# Patient Record
Sex: Male | Born: 1970 | Race: Black or African American | Hispanic: No | Marital: Single | Smoking: Never smoker
Health system: Southern US, Community
[De-identification: ages and names within clinical notes are randomized; demographics above are authoritative.]

## PROBLEM LIST (undated history)

## (undated) DIAGNOSIS — W3400XA Accidental discharge from unspecified firearms or gun, initial encounter: Secondary | ICD-10-CM

## (undated) DIAGNOSIS — I10 Essential (primary) hypertension: Secondary | ICD-10-CM

## (undated) HISTORY — PX: KNEE SURGERY: SHX244

## (undated) HISTORY — PX: JOINT REPLACEMENT: SHX530

---

## 2000-01-22 ENCOUNTER — Ambulatory Visit (HOSPITAL_COMMUNITY): Admission: RE | Admit: 2000-01-22 | Discharge: 2000-01-22 | Payer: Self-pay | Admitting: Family Medicine

## 2000-01-22 ENCOUNTER — Encounter: Payer: Self-pay | Admitting: Family Medicine

## 2000-07-07 ENCOUNTER — Ambulatory Visit (HOSPITAL_COMMUNITY): Admission: RE | Admit: 2000-07-07 | Discharge: 2000-07-07 | Payer: Self-pay

## 2004-09-29 ENCOUNTER — Ambulatory Visit (HOSPITAL_COMMUNITY): Admission: RE | Admit: 2004-09-29 | Discharge: 2004-09-29 | Payer: Self-pay | Admitting: Specialist

## 2008-11-12 ENCOUNTER — Encounter: Payer: Self-pay | Admitting: Orthopaedic Surgery

## 2008-11-12 ENCOUNTER — Inpatient Hospital Stay (HOSPITAL_COMMUNITY): Admission: EM | Admit: 2008-11-12 | Discharge: 2008-11-16 | Payer: Self-pay | Admitting: Emergency Medicine

## 2010-09-29 LAB — COMPREHENSIVE METABOLIC PANEL
Albumin: 4.3 g/dL (ref 3.5–5.2)
Alkaline Phosphatase: 63 U/L (ref 39–117)
BUN: 9 mg/dL (ref 6–23)
CO2: 25 mEq/L (ref 19–32)
Chloride: 104 mEq/L (ref 96–112)
Creatinine, Ser: 0.78 mg/dL (ref 0.4–1.5)
GFR calc non Af Amer: >60 mL/min (ref >60)
Potassium: 4.4 mEq/L (ref 3.5–5.1)
Total Bilirubin: 0.7 mg/dL (ref 0.3–1.2)

## 2010-09-29 LAB — DIFFERENTIAL
Basophils Absolute: 0.1 10*3/uL (ref 0.0–0.1)
Basophils Relative: 1 % (ref 0–1)
Eosinophils Relative: 0 % (ref 0–5)
Monocytes Absolute: 0.4 10*3/uL (ref 0.1–1.0)
Neutro Abs: 9.7 10*3/uL — ABNORMAL HIGH (ref 1.7–7.7)

## 2010-09-29 LAB — CBC
HCT: 38.6 % — ABNORMAL LOW (ref 39.0–52.0)
Hemoglobin: 13.1 g/dL (ref 13.0–17.0)
MCHC: 33.9 g/dL (ref 30.0–36.0)
MCV: 90.8 fL (ref 78.0–100.0)
RBC: 4.24 MIL/uL (ref 4.22–5.81)
WBC: 10.8 10*3/uL — ABNORMAL HIGH (ref 4.0–10.5)

## 2010-09-29 LAB — APTT: aPTT: 24 seconds (ref 24–37)

## 2010-09-29 LAB — URINALYSIS, ROUTINE W REFLEX MICROSCOPIC
Bilirubin Urine: NEGATIVE
Glucose, UA: NEGATIVE mg/dL
Hgb urine dipstick: NEGATIVE
Specific Gravity, Urine: 1.007 (ref 1.005–1.030)
Urobilinogen, UA: 0.2 mg/dL (ref 0.0–1.0)
pH: 6 (ref 5.0–8.0)

## 2010-09-29 LAB — TYPE AND SCREEN

## 2010-09-29 LAB — HIV ANTIBODY (ROUTINE TESTING W REFLEX): HIV: NONREACTIVE

## 2010-09-29 LAB — PROTIME-INR: Prothrombin Time: 14.1 seconds (ref 11.6–15.2)

## 2010-09-29 LAB — CK: Total CK: 133 U/L (ref 7–232)
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
## 2010-12-20 ENCOUNTER — Emergency Department (HOSPITAL_COMMUNITY)
Admission: EM | Admit: 2010-12-20 | Discharge: 2010-12-20 | Disposition: A | Discharge status: Other Acute Inpatient Hospital | Payer: Self-pay | Attending: Emergency Medicine | Admitting: Emergency Medicine

## 2010-12-20 DIAGNOSIS — T25319A Burn of third degree of unspecified ankle, initial encounter: Secondary | ICD-10-CM | POA: Insufficient documentation

## 2010-12-20 DIAGNOSIS — T23279A Burn of second degree of unspecified wrist, initial encounter: Secondary | ICD-10-CM | POA: Insufficient documentation

## 2010-12-20 DIAGNOSIS — Y990 Civilian activity done for income or pay: Secondary | ICD-10-CM | POA: Insufficient documentation

## 2010-12-20 DIAGNOSIS — X19XXXA Contact with other heat and hot substances, initial encounter: Secondary | ICD-10-CM | POA: Insufficient documentation

## 2010-12-20 DIAGNOSIS — M7989 Other specified soft tissue disorders: Secondary | ICD-10-CM | POA: Insufficient documentation

## 2010-12-20 DIAGNOSIS — T24339A Burn of third degree of unspecified lower leg, initial encounter: Secondary | ICD-10-CM | POA: Insufficient documentation

## 2010-12-20 DIAGNOSIS — T23359A Burn of third degree of unspecified palm, initial encounter: Secondary | ICD-10-CM | POA: Insufficient documentation

## 2010-12-27 ENCOUNTER — Inpatient Hospital Stay (HOSPITAL_COMMUNITY)
Admission: EM | Admit: 2010-12-27 | Discharge: 2011-01-06 | DRG: 935 | Disposition: A | Discharge status: Home Health | Payer: Self-pay | Attending: Family Medicine | Admitting: Family Medicine

## 2010-12-27 DIAGNOSIS — T148XXA Other injury of unspecified body region, initial encounter: Secondary | ICD-10-CM | POA: Diagnosis present

## 2010-12-27 DIAGNOSIS — Y998 Other external cause status: Secondary | ICD-10-CM

## 2010-12-27 DIAGNOSIS — X131XXA Other contact with steam and other hot vapors, initial encounter: Secondary | ICD-10-CM | POA: Diagnosis present

## 2010-12-27 DIAGNOSIS — L089 Local infection of the skin and subcutaneous tissue, unspecified: Secondary | ICD-10-CM | POA: Diagnosis present

## 2010-12-27 DIAGNOSIS — Z5900 Homelessness unspecified: Secondary | ICD-10-CM

## 2010-12-27 DIAGNOSIS — D638 Anemia in other chronic diseases classified elsewhere: Secondary | ICD-10-CM | POA: Diagnosis present

## 2010-12-27 DIAGNOSIS — X12XXXA Contact with other hot fluids, initial encounter: Secondary | ICD-10-CM | POA: Diagnosis present

## 2010-12-27 DIAGNOSIS — T23209A Burn of second degree of unspecified hand, unspecified site, initial encounter: Principal | ICD-10-CM | POA: Diagnosis present

## 2010-12-27 DIAGNOSIS — I10 Essential (primary) hypertension: Secondary | ICD-10-CM | POA: Diagnosis present

## 2010-12-27 DIAGNOSIS — T25229A Burn of second degree of unspecified foot, initial encounter: Secondary | ICD-10-CM | POA: Diagnosis present

## 2010-12-27 DIAGNOSIS — Y9229 Other specified public building as the place of occurrence of the external cause: Secondary | ICD-10-CM

## 2010-12-27 LAB — BASIC METABOLIC PANEL
CO2: 22 mEq/L (ref 19–32)
Chloride: 96 mEq/L (ref 96–112)
Glucose, Bld: 89 mg/dL (ref 70–99)
Potassium: 3.6 mEq/L (ref 3.5–5.1)
Sodium: 133 mEq/L — ABNORMAL LOW (ref 135–145)

## 2010-12-27 LAB — DIFFERENTIAL
Basophils Absolute: 0 10*3/uL (ref 0.0–0.1)
Basophils Relative: 0 % (ref 0–1)
Lymphocytes Relative: 16 % (ref 12–46)
Neutro Abs: 7.5 10*3/uL (ref 1.7–7.7)
Neutrophils Relative %: 70 % (ref 43–77)

## 2010-12-27 LAB — URINALYSIS, ROUTINE W REFLEX MICROSCOPIC
Glucose, UA: NEGATIVE mg/dL
Hgb urine dipstick: NEGATIVE
Specific Gravity, Urine: 1.017 (ref 1.005–1.030)
Urobilinogen, UA: 1 mg/dL (ref 0.0–1.0)
pH: 5.5 (ref 5.0–8.0)

## 2010-12-27 LAB — CBC
Hemoglobin: 12.4 g/dL — ABNORMAL LOW (ref 13.0–17.0)
RBC: 4.01 MIL/uL — ABNORMAL LOW (ref 4.22–5.81)

## 2010-12-28 ENCOUNTER — Inpatient Hospital Stay (HOSPITAL_COMMUNITY): Payer: Self-pay

## 2010-12-28 LAB — MRSA PCR SCREENING: MRSA by PCR: NEGATIVE

## 2010-12-29 LAB — CBC
HCT: 32.9 % — ABNORMAL LOW (ref 39.0–52.0)
RDW: 12.5 % (ref 11.5–15.5)
WBC: 7.1 10*3/uL (ref 4.0–10.5)
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
## 2010-12-31 LAB — BASIC METABOLIC PANEL
CO2: 30 mEq/L (ref 19–32)
Calcium: 8.8 mg/dL (ref 8.4–10.5)
Glucose, Bld: 93 mg/dL (ref 70–99)
Potassium: 3.9 mEq/L (ref 3.5–5.1)
Sodium: 138 mEq/L (ref 135–145)

## 2010-12-31 LAB — CBC
Hemoglobin: 11.1 g/dL — ABNORMAL LOW (ref 13.0–17.0)
MCH: 30 pg (ref 26.0–34.0)
RBC: 3.7 MIL/uL — ABNORMAL LOW (ref 4.22–5.81)

## 2010-12-31 LAB — IRON AND TIBC
Iron: 46 ug/dL (ref 42–135)
UIBC: 168 ug/dL

## 2010-12-31 LAB — FOLATE: Folate: 11.5 ng/mL

## 2010-12-31 LAB — HIV 1/2 CONFIRMATION: HIV-1 antibody: NEGATIVE

## 2011-01-01 LAB — SURGICAL PCR SCREEN
MRSA, PCR: NEGATIVE
Staphylococcus aureus: POSITIVE — AB
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
## 2011-01-02 LAB — CBC
MCV: 91.1 fL (ref 78.0–100.0)
Platelets: 504 10*3/uL — ABNORMAL HIGH (ref 150–400)
RDW: 12.8 % (ref 11.5–15.5)
WBC: 7.4 10*3/uL (ref 4.0–10.5)

## 2011-01-02 LAB — BASIC METABOLIC PANEL
CO2: 33 mEq/L — ABNORMAL HIGH (ref 19–32)
Calcium: 9.3 mg/dL (ref 8.4–10.5)
Creatinine, Ser: 0.49 mg/dL — ABNORMAL LOW (ref 0.50–1.35)
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
## 2011-01-06 NOTE — Discharge Summary (Signed)
  NAMEONDRE, Trevor Blackwell NO.:  [id number]  MEDICAL RECORD NO.:  [id number]  LOCATION:  4507                         FACILITY:  [facility]  PHYSICIAN:  Standley Dakins, MD   DATE OF BIRTH:  1970-12-14  DATE OF ADMISSION:  12/27/2010 DATE OF DISCHARGE:  01/06/2011                              DISCHARGE SUMMARY   ADDENDUM: Addendum to the discharge summary dictated on January 05, 2011, job number [id number].  DISCHARGE MEDICATIONS: 1. Augmentin 875 mg one p.o. b.i.d. x7 days. 2. Folic acid 1 mg one p.o. daily. 3. Hydrocodone acetaminophen 5/325 one p.o. q.4 h. p.r.n. pain. 4. Lisinopril 20 mg 1 tablet p.o. daily. 5. Multivitamin 1 p.o. daily with minerals. 6. Silver sulfadiazine 1% cream applied topically twice daily to     wounds. 7. Thiamine 100 mg tablets one p.o. by mouth daily.  ADDENDUM TO HOSPITAL COURSE:  The patient was seen and evaluated on January 06, 2011.  The patient was evaluated by Dr. Kelly Splinter with Plastic Surgery and the wound VAC was removed.  They have not recommended that be continued.  The patient will continue to have wound care and wound changes as recommended by Plastic Surgery.  Arrangements have been made for the patient with the [facility] program, so that he can have at least a 30- day hotel stay and also we will continue to receive home health nursing, PT/OT and a home aide.  These were arranged prior to discharge.  Also arrangements made to help the patient obtain his medications at discharge, so that he will have them available.  Transportation arrangements will also be made for the patient.  The patient was evaluated on January 06, 2011, in no distress, cooperative and pleasant. Wound dressings have recently been changed.  PHYSICAL EXAMINATION:  VITAL SIGNS:  Were reviewed.  Temperature 98.2, pulse 76, respirations 20, blood pressure 144/86, and pulse ox 98% on room air. GENERAL:  Well-developed, well-nourished male.  He is awake, alert in  no distress, cooperative, and pleasant. HEENT: Normocephalic, atraumatic. LUNGS:  Clear to auscultation bilateral. CARDIAC:  Normal S1, S2 sounds without murmurs, rubs, or gallops. ABDOMEN:  Soft, nondistended, nontender, no masses. EXTREMITIES:  No clubbing, cyanosis, or edema. SKIN:  Wounds dressings clean, dry, and intact.  IMPRESSION/PLAN:  The patient is evaluated and stable for discharge with the whole program with the interventions mentioned above to help him to recover with dignity.  I spent 40 minutes preparing this patient's discharge, reviewing medical records, arranging followup reconciling medications and counseling with the care coordination team and with the patient.     Standley Dakins, MD     CJ/MEDQ  D:  01/06/2011  T:  01/06/2011  Job:  [id number]  Electronically Signed by Standley Dakins  on 01/06/2011 06:10:50 PM
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
## 2011-01-27 ENCOUNTER — Encounter (HOSPITAL_BASED_OUTPATIENT_CLINIC_OR_DEPARTMENT_OTHER): Payer: Self-pay | Attending: Plastic Surgery

## 2011-01-27 DIAGNOSIS — Z79899 Other long term (current) drug therapy: Secondary | ICD-10-CM | POA: Insufficient documentation

## 2011-01-27 DIAGNOSIS — I10 Essential (primary) hypertension: Secondary | ICD-10-CM | POA: Insufficient documentation

## 2011-01-27 DIAGNOSIS — T310 Burns involving less than 10% of body surface: Secondary | ICD-10-CM | POA: Insufficient documentation

## 2011-01-27 DIAGNOSIS — X19XXXA Contact with other heat and hot substances, initial encounter: Secondary | ICD-10-CM | POA: Insufficient documentation

## 2011-01-27 DIAGNOSIS — T25029A Burn of unspecified degree of unspecified foot, initial encounter: Secondary | ICD-10-CM | POA: Insufficient documentation

## 2011-01-27 DIAGNOSIS — T2200XA Burn of unspecified degree of shoulder and upper limb, except wrist and hand, unspecified site, initial encounter: Secondary | ICD-10-CM | POA: Insufficient documentation
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
## 2011-02-24 ENCOUNTER — Encounter (HOSPITAL_BASED_OUTPATIENT_CLINIC_OR_DEPARTMENT_OTHER): Payer: Self-pay | Attending: Plastic Surgery

## 2011-02-24 DIAGNOSIS — X19XXXA Contact with other heat and hot substances, initial encounter: Secondary | ICD-10-CM | POA: Insufficient documentation

## 2011-02-24 DIAGNOSIS — T2200XA Burn of unspecified degree of shoulder and upper limb, except wrist and hand, unspecified site, initial encounter: Secondary | ICD-10-CM | POA: Insufficient documentation

## 2011-02-24 DIAGNOSIS — I10 Essential (primary) hypertension: Secondary | ICD-10-CM | POA: Insufficient documentation

## 2011-02-24 DIAGNOSIS — T310 Burns involving less than 10% of body surface: Secondary | ICD-10-CM | POA: Insufficient documentation

## 2011-02-24 DIAGNOSIS — T25029A Burn of unspecified degree of unspecified foot, initial encounter: Secondary | ICD-10-CM | POA: Insufficient documentation

## 2011-02-24 DIAGNOSIS — Z79899 Other long term (current) drug therapy: Secondary | ICD-10-CM | POA: Insufficient documentation

## 2011-02-24 DIAGNOSIS — L97809 Non-pressure chronic ulcer of other part of unspecified lower leg with unspecified severity: Secondary | ICD-10-CM | POA: Insufficient documentation
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
## 2011-05-31 ENCOUNTER — Emergency Department (HOSPITAL_COMMUNITY)
Admission: EM | Admit: 2011-05-31 | Discharge: 2011-06-01 | Disposition: A | Discharge status: Home / Self Care | Payer: Self-pay | Attending: Emergency Medicine | Admitting: Emergency Medicine

## 2011-05-31 ENCOUNTER — Encounter: Payer: Self-pay | Admitting: Adult Health

## 2011-05-31 DIAGNOSIS — R1110 Vomiting, unspecified: Secondary | ICD-10-CM | POA: Insufficient documentation

## 2011-05-31 DIAGNOSIS — F1010 Alcohol abuse, uncomplicated: Secondary | ICD-10-CM | POA: Insufficient documentation

## 2011-05-31 DIAGNOSIS — F10929 Alcohol use, unspecified with intoxication, unspecified: Secondary | ICD-10-CM

## 2011-05-31 DIAGNOSIS — R10816 Epigastric abdominal tenderness: Secondary | ICD-10-CM | POA: Insufficient documentation

## 2011-05-31 DIAGNOSIS — R109 Unspecified abdominal pain: Secondary | ICD-10-CM | POA: Insufficient documentation

## 2011-05-31 LAB — COMPREHENSIVE METABOLIC PANEL
ALT: 20 U/L (ref 0–53)
AST: 36 U/L (ref 0–37)
Albumin: 3.8 g/dL (ref 3.5–5.2)
Alkaline Phosphatase: 64 U/L (ref 39–117)
Chloride: 105 mEq/L (ref 96–112)
Potassium: 3.4 mEq/L — ABNORMAL LOW (ref 3.5–5.1)
Sodium: 143 mEq/L (ref 135–145)
Total Bilirubin: 0.3 mg/dL (ref 0.3–1.2)

## 2011-05-31 LAB — CBC
Hemoglobin: 12.5 g/dL — ABNORMAL LOW (ref 13.0–17.0)
MCHC: 33.2 g/dL (ref 30.0–36.0)
Platelets: 191 10*3/uL (ref 150–400)
RDW: 14.3 % (ref 11.5–15.5)

## 2011-05-31 LAB — DIFFERENTIAL
Basophils Absolute: 0 10*3/uL (ref 0.0–0.1)
Basophils Relative: 1 % (ref 0–1)
Monocytes Relative: 10 % (ref 3–12)
Neutro Abs: 1.9 10*3/uL (ref 1.7–7.7)
Neutrophils Relative %: 46 % (ref 43–77)

## 2011-05-31 LAB — RAPID URINE DRUG SCREEN, HOSP PERFORMED
Amphetamines: NOT DETECTED
Benzodiazepines: NOT DETECTED
Tetrahydrocannabinol: NOT DETECTED
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
## 2011-05-31 NOTE — ED Notes (Signed)
Pt refused a PIV

## 2011-06-01 LAB — ETHANOL: Alcohol, Ethyl (B): 253 mg/dL — ABNORMAL HIGH (ref 0–11)

## 2011-06-01 NOTE — ED Provider Notes (Signed)
Medical screening examination/treatment/procedure(s) were performed by non-physician practitioner and as supervising physician I was immediately available for consultation/collaboration.  Denee Boeder P Terrance Lanahan, MD 06/01/11 0016 

## 2011-06-01 NOTE — ED Provider Notes (Signed)
Medical screening examination/treatment/procedure(s) were performed by non-physician practitioner and as supervising physician I was immediately available for consultation/collaboration.   Lyanne Co, MD 06/01/11 [phone]
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
## 2011-06-01 NOTE — ED Provider Notes (Signed)
Medical screening examination/treatment/procedure(s) were performed by non-physician practitioner and as supervising physician I was immediately available for consultation/collaboration.   Lyanne Co, MD 06/01/11 2226

## 2012-02-19 ENCOUNTER — Emergency Department (HOSPITAL_COMMUNITY)
Admission: EM | Admit: 2012-02-19 | Discharge: 2012-02-20 | Disposition: A | Discharge status: Home / Self Care | Payer: Self-pay | Attending: Emergency Medicine | Admitting: Emergency Medicine

## 2012-02-19 ENCOUNTER — Encounter (HOSPITAL_COMMUNITY): Payer: Self-pay | Admitting: *Deleted

## 2012-02-19 DIAGNOSIS — R5381 Other malaise: Secondary | ICD-10-CM | POA: Insufficient documentation

## 2012-02-19 DIAGNOSIS — F10929 Alcohol use, unspecified with intoxication, unspecified: Secondary | ICD-10-CM

## 2012-02-19 DIAGNOSIS — R269 Unspecified abnormalities of gait and mobility: Secondary | ICD-10-CM | POA: Insufficient documentation

## 2012-02-19 DIAGNOSIS — F1010 Alcohol abuse, uncomplicated: Secondary | ICD-10-CM | POA: Insufficient documentation

## 2012-02-19 HISTORY — DX: Accidental discharge from unspecified firearms or gun, initial encounter: W34.00XA

## 2012-02-19 LAB — POCT I-STAT, CHEM 8
BUN: 4 mg/dL — ABNORMAL LOW (ref 6–23)
Calcium, Ion: 0.98 mmol/L — ABNORMAL LOW (ref 1.12–1.23)
Glucose, Bld: 86 mg/dL (ref 70–99)
HCT: 39 % (ref 39.0–52.0)
TCO2: 25 mmol/L (ref 0–100)

## 2012-02-19 LAB — ETHANOL: Alcohol, Ethyl (B): 376 mg/dL — ABNORMAL HIGH (ref 0–11)

## 2012-02-19 LAB — GLUCOSE, CAPILLARY: Glucose-Capillary: 79 mg/dL (ref 70–99)

## 2012-02-19 MED ORDER — DILTIAZEM HCL 25 MG/5ML IV SOLN
15.0000 mg | Freq: Once | INTRAVENOUS | Status: DC
Start: 2012-02-19 — End: 2012-02-19

## 2012-02-19 MED ORDER — DILTIAZEM LOAD VIA INFUSION: 15.0000 mg | Freq: Once | INTRAVENOUS | Status: DC
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
## 2012-02-19 NOTE — ED Notes (Signed)
Pt. Is alert but Does not speak when asked what brings him to the ED. Pt had vomit on clothes; changed into gown and placed on monitor.
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
## 2012-02-20 LAB — LIPASE, BLOOD: Lipase: 20 U/L (ref 11–59)

## 2012-02-20 LAB — HEPATIC FUNCTION PANEL
Alkaline Phosphatase: 108 U/L (ref 39–117)
Bilirubin, Direct: 0.3 mg/dL (ref 0.0–0.3)
Indirect Bilirubin: 0.5 mg/dL (ref 0.3–0.9)
Total Bilirubin: 0.8 mg/dL (ref 0.3–1.2)

## 2012-02-20 MED ORDER — ONDANSETRON HCL 4 MG/2ML IJ SOLN
4.0000 mg | Freq: Once | INTRAMUSCULAR | Status: AC
  Administered 2012-02-20: 4 mg via INTRAVENOUS
  Filled 2012-02-20: qty 2

## 2012-02-20 MED ORDER — LORAZEPAM 2 MG/ML IJ SOLN
1.0000 mg | Freq: Once | INTRAMUSCULAR | Status: AC
  Administered 2012-02-20: 10:00:00 via INTRAVENOUS
  Filled 2012-02-20: qty 1

## 2012-02-20 MED ORDER — SODIUM CHLORIDE 0.9 % IV BOLUS (SEPSIS)
1000.0000 mL | Freq: Once | INTRAVENOUS | Status: AC
  Administered 2012-02-20: 1000 mL via INTRAVENOUS
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
## 2012-02-20 NOTE — ED Notes (Signed)
Patient discharged with a friend instructions given and teach back method used.

## 2012-02-20 NOTE — ED Notes (Signed)
Shower completed Social worker contacted to talk with the patients sponsor. He advised that the patient is homeless.
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
## 2012-02-20 NOTE — ED Provider Notes (Signed)
  Physical Exam  BP 130/87  Pulse 99  Temp 98 F (36.7 C)  Resp 20  SpO2 100%  Physical Exam  ED Course  Procedures  MDM Patient's mental status improved. He is tolerated orals. His sponsor is in this country have seen him and will help arrange followup.      Juliet Rude. Rubin Payor, MD 02/20/12 [phone]

## 2012-09-05 ENCOUNTER — Emergency Department (HOSPITAL_COMMUNITY): Payer: Self-pay

## 2012-09-05 ENCOUNTER — Emergency Department (HOSPITAL_COMMUNITY)
Admission: EM | Admit: 2012-09-05 | Discharge: 2012-09-05 | Disposition: A | Discharge status: Home / Self Care | Payer: Self-pay | Attending: Emergency Medicine | Admitting: Emergency Medicine

## 2012-09-05 ENCOUNTER — Encounter (HOSPITAL_COMMUNITY): Payer: Self-pay | Admitting: Emergency Medicine

## 2012-09-05 DIAGNOSIS — S8000XA Contusion of unspecified knee, initial encounter: Secondary | ICD-10-CM | POA: Insufficient documentation

## 2012-09-05 DIAGNOSIS — G8929 Other chronic pain: Secondary | ICD-10-CM | POA: Insufficient documentation

## 2012-09-05 DIAGNOSIS — W19XXXA Unspecified fall, initial encounter: Secondary | ICD-10-CM

## 2012-09-05 DIAGNOSIS — R296 Repeated falls: Secondary | ICD-10-CM | POA: Insufficient documentation

## 2012-09-05 DIAGNOSIS — IMO0002 Reserved for concepts with insufficient information to code with codable children: Secondary | ICD-10-CM | POA: Insufficient documentation

## 2012-09-05 DIAGNOSIS — S8001XA Contusion of right knee, initial encounter: Secondary | ICD-10-CM

## 2012-09-05 DIAGNOSIS — Z87828 Personal history of other (healed) physical injury and trauma: Secondary | ICD-10-CM | POA: Insufficient documentation

## 2012-09-05 DIAGNOSIS — Y9301 Activity, walking, marching and hiking: Secondary | ICD-10-CM | POA: Insufficient documentation

## 2012-09-05 DIAGNOSIS — Y9289 Other specified places as the place of occurrence of the external cause: Secondary | ICD-10-CM | POA: Insufficient documentation

## 2012-09-05 MED ORDER — HYDROCODONE-ACETAMINOPHEN 5-325 MG PO TABS: 1.0000 | ORAL_TABLET | Freq: Once | ORAL | Status: DC

## 2012-09-05 MED ORDER — HYDROCODONE-ACETAMINOPHEN 5-325 MG PO TABS: 1.0000 | ORAL_TABLET | Freq: Four times a day (QID) | ORAL | Status: DC | PRN
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
## 2012-09-05 NOTE — ED Notes (Signed)
MD at bedside. 

## 2012-09-05 NOTE — ED Notes (Addendum)
Pt was walking this morning on the sidewalk.  States chronic rt knee pain d/t surgery.  Fall made his knee pain worse.  Pt is ambulatory into ED.

## 2012-09-05 NOTE — ED Notes (Signed)
Patient transported to X-ray 

## 2012-09-05 NOTE — ED Notes (Signed)
[id number]<AV> Expected date:<BR> Expected time:<BR> Means of arrival:<BR> Comments:<BR> 42 y/o M Knee pain

## 2012-11-24 ENCOUNTER — Emergency Department (HOSPITAL_COMMUNITY)
Admission: EM | Admit: 2012-11-24 | Discharge: 2012-11-25 | Disposition: A | Discharge status: Home / Self Care | Payer: Self-pay | Attending: Emergency Medicine | Admitting: Emergency Medicine

## 2012-11-24 ENCOUNTER — Encounter (HOSPITAL_COMMUNITY): Payer: Self-pay

## 2012-11-24 DIAGNOSIS — F10221 Alcohol dependence with intoxication delirium: Secondary | ICD-10-CM

## 2012-11-24 DIAGNOSIS — R1110 Vomiting, unspecified: Secondary | ICD-10-CM | POA: Insufficient documentation

## 2012-11-24 DIAGNOSIS — F10229 Alcohol dependence with intoxication, unspecified: Secondary | ICD-10-CM | POA: Insufficient documentation

## 2012-11-24 DIAGNOSIS — Z87828 Personal history of other (healed) physical injury and trauma: Secondary | ICD-10-CM | POA: Insufficient documentation

## 2012-11-24 LAB — BASIC METABOLIC PANEL
Calcium: 9.3 mg/dL (ref 8.4–10.5)
Creatinine, Ser: 0.58 mg/dL (ref 0.50–1.35)
GFR calc Af Amer: >90 mL/min (ref >90)

## 2012-11-24 LAB — CBC WITH DIFFERENTIAL/PLATELET
Basophils Absolute: 0 10*3/uL (ref 0.0–0.1)
Lymphocytes Relative: 27 % (ref 12–46)
Lymphs Abs: 1.2 10*3/uL (ref 0.7–4.0)
MCV: 87.5 fL (ref 78.0–100.0)
Monocytes Relative: 8 % (ref 3–12)
Platelets: 85 10*3/uL — ABNORMAL LOW (ref 150–400)
RDW: 13.6 % (ref 11.5–15.5)
WBC: 4.4 10*3/uL (ref 4.0–10.5)

## 2012-11-24 NOTE — ED Notes (Signed)
Pt given urinal and was informed that a urine sample was needed.  Pt unable to void at this time.
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
## 2012-11-25 ENCOUNTER — Encounter (HOSPITAL_COMMUNITY): Payer: Self-pay

## 2012-11-25 ENCOUNTER — Emergency Department (HOSPITAL_COMMUNITY)
Admission: EM | Admit: 2012-11-25 | Discharge: 2012-11-26 | Disposition: A | Discharge status: Home / Self Care | Payer: Self-pay | Attending: Emergency Medicine | Admitting: Emergency Medicine

## 2012-11-25 DIAGNOSIS — Z5900 Homelessness unspecified: Secondary | ICD-10-CM | POA: Insufficient documentation

## 2012-11-25 DIAGNOSIS — F10229 Alcohol dependence with intoxication, unspecified: Secondary | ICD-10-CM | POA: Insufficient documentation

## 2012-11-25 DIAGNOSIS — K529 Noninfective gastroenteritis and colitis, unspecified: Secondary | ICD-10-CM

## 2012-11-25 DIAGNOSIS — K5289 Other specified noninfective gastroenteritis and colitis: Secondary | ICD-10-CM | POA: Insufficient documentation

## 2012-11-25 DIAGNOSIS — Z87828 Personal history of other (healed) physical injury and trauma: Secondary | ICD-10-CM | POA: Insufficient documentation

## 2012-11-25 DIAGNOSIS — F10220 Alcohol dependence with intoxication, uncomplicated: Secondary | ICD-10-CM

## 2012-11-25 LAB — COMPREHENSIVE METABOLIC PANEL
ALT: 90 U/L — ABNORMAL HIGH (ref 0–53)
AST: 146 U/L — ABNORMAL HIGH (ref 0–37)
Albumin: 4.1 g/dL (ref 3.5–5.2)
Calcium: 9.6 mg/dL (ref 8.4–10.5)
Sodium: 137 mEq/L (ref 135–145)
Total Protein: 8.2 g/dL (ref 6.0–8.3)

## 2012-11-25 LAB — CBC WITH DIFFERENTIAL/PLATELET
Basophils Absolute: 0 10*3/uL (ref 0.0–0.1)
Basophils Relative: 0 % (ref 0–1)
Eosinophils Absolute: 0 10*3/uL (ref 0.0–0.7)
Eosinophils Relative: 0 % (ref 0–5)
Lymphocytes Relative: 10 % — ABNORMAL LOW (ref 12–46)
MCH: 30.5 pg (ref 26.0–34.0)
MCHC: 34.6 g/dL (ref 30.0–36.0)
MCV: 88.3 fL (ref 78.0–100.0)
Platelets: 75 10*3/uL — ABNORMAL LOW (ref 150–400)
RDW: 13.6 % (ref 11.5–15.5)
WBC: 8.2 10*3/uL (ref 4.0–10.5)

## 2012-11-25 MED ORDER — LORAZEPAM 1 MG PO TABS
1.0000 mg | ORAL_TABLET | Freq: Once | ORAL | Status: AC
  Administered 2012-11-25: 1 mg via ORAL
  Filled 2012-11-25: qty 1

## 2012-11-25 MED ORDER — ONDANSETRON 4 MG PO TBDP
4.0000 mg | ORAL_TABLET | Freq: Once | ORAL | Status: AC
  Administered 2012-11-25: 4 mg via ORAL
  Filled 2012-11-25: qty 1

## 2012-11-25 NOTE — ED Notes (Signed)
[id number]<BM> Expected date:<BR> Expected time:<BR> Means of arrival:<BR> Comments:<BR> Bed 2
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
## 2012-11-25 NOTE — ED Notes (Signed)
Pt drank lots of water and is oriented (x2). Pt only ate a small portion of breakfast before falling back asleep.

## 2012-11-25 NOTE — ED Notes (Signed)
Pt ambulated well 

## 2012-11-25 NOTE — ED Notes (Signed)
Pt c/o nausea and dry heaves following PO challenge. EDPA present during episode and gave verbal order for 4 mg of Zofran ODT. Pt also given a emesis bag.

## 2012-11-25 NOTE — ED Notes (Signed)
PT VERY DIFFICULT TO AROUSE. PT NOT AMBULATORY

## 2012-11-25 NOTE — ED Notes (Signed)
He pensively tells me he is "sick" and points to epigastrum as he states "I hurt here".  H also tells me he has vomited x 2 since yesterday.  He is calm and cooperative, and I smell ETOH on his breath.  His skin is normal, warm and dry, and he is breathing normally.

## 2012-11-25 NOTE — ED Notes (Signed)
Woke pt to get vitals, pt states still unable to urinate.

## 2012-11-25 NOTE — ED Provider Notes (Signed)
Pt ambulating without problem.  Alert and oriented.  abd exam benign.  Rolan Bucco, MD 11/25/12 [phone]

## 2012-11-26 MED ORDER — FOLIC ACID 1 MG PO TABS: 1.0000 mg | ORAL_TABLET | Freq: Every day | ORAL | Status: DC

## 2012-11-26 MED ORDER — VITAMIN B-1 100 MG PO TABS: 100.0000 mg | ORAL_TABLET | Freq: Every day | ORAL | Status: DC

## 2012-11-26 MED ORDER — THIAMINE HCL 100 MG/ML IJ SOLN: 100.0000 mg | Freq: Every day | INTRAMUSCULAR | Status: DC

## 2012-11-26 MED ORDER — LORAZEPAM 2 MG/ML IJ SOLN: 1.0000 mg | Freq: Four times a day (QID) | INTRAMUSCULAR | Status: DC | PRN

## 2012-11-26 MED ORDER — ADULT MULTIVITAMIN W/MINERALS CH: 1.0000 | ORAL_TABLET | Freq: Every day | ORAL | Status: DC

## 2012-11-26 MED ORDER — ONDANSETRON HCL 4 MG PO TABS: 4.0000 mg | ORAL_TABLET | Freq: Four times a day (QID) | ORAL | Status: DC

## 2012-11-26 MED ORDER — LORAZEPAM 1 MG PO TABS: 1.0000 mg | ORAL_TABLET | Freq: Four times a day (QID) | ORAL | Status: DC | PRN

## 2012-11-26 NOTE — ED Provider Notes (Signed)
Medical screening examination/treatment/procedure(s) were performed by non-physician practitioner and as supervising physician I was immediately available for consultation/collaboration.  Gilda Crease, MD 11/26/12 2248

## 2013-04-26 ENCOUNTER — Emergency Department (HOSPITAL_COMMUNITY): Payer: Self-pay

## 2013-04-26 ENCOUNTER — Encounter (HOSPITAL_COMMUNITY): Payer: Self-pay | Admitting: Emergency Medicine

## 2013-04-26 ENCOUNTER — Emergency Department (HOSPITAL_COMMUNITY)
Admission: EM | Admit: 2013-04-26 | Discharge: 2013-04-26 | Disposition: A | Discharge status: Home / Self Care | Payer: Self-pay | Attending: Emergency Medicine | Admitting: Emergency Medicine

## 2013-04-26 DIAGNOSIS — M25561 Pain in right knee: Secondary | ICD-10-CM

## 2013-04-26 DIAGNOSIS — S8990XA Unspecified injury of unspecified lower leg, initial encounter: Secondary | ICD-10-CM | POA: Insufficient documentation

## 2013-04-26 DIAGNOSIS — W1809XA Striking against other object with subsequent fall, initial encounter: Secondary | ICD-10-CM | POA: Insufficient documentation

## 2013-04-26 DIAGNOSIS — Y929 Unspecified place or not applicable: Secondary | ICD-10-CM | POA: Insufficient documentation

## 2013-04-26 DIAGNOSIS — Z87828 Personal history of other (healed) physical injury and trauma: Secondary | ICD-10-CM | POA: Insufficient documentation

## 2013-04-26 DIAGNOSIS — W19XXXA Unspecified fall, initial encounter: Secondary | ICD-10-CM

## 2013-04-26 DIAGNOSIS — Y9301 Activity, walking, marching and hiking: Secondary | ICD-10-CM | POA: Insufficient documentation

## 2013-04-26 MED ORDER — HYDROCODONE-ACETAMINOPHEN 5-325 MG PO TABS
2.0000 | ORAL_TABLET | Freq: Once | ORAL | Status: AC
  Administered 2013-04-26: 2 via ORAL
  Filled 2013-04-26: qty 2
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
## 2013-05-07 ENCOUNTER — Emergency Department (HOSPITAL_COMMUNITY)
Admission: EM | Admit: 2013-05-07 | Discharge: 2013-05-15 | Disposition: A | Discharge status: Other Acute Inpatient Hospital | Payer: Federal, State, Local not specified - Other | Attending: Emergency Medicine | Admitting: Emergency Medicine

## 2013-05-07 ENCOUNTER — Encounter (HOSPITAL_COMMUNITY): Payer: Self-pay | Admitting: Emergency Medicine

## 2013-05-07 DIAGNOSIS — R45851 Suicidal ideations: Secondary | ICD-10-CM | POA: Insufficient documentation

## 2013-05-07 DIAGNOSIS — F411 Generalized anxiety disorder: Secondary | ICD-10-CM | POA: Insufficient documentation

## 2013-05-07 DIAGNOSIS — F10229 Alcohol dependence with intoxication, unspecified: Secondary | ICD-10-CM | POA: Insufficient documentation

## 2013-05-07 DIAGNOSIS — R443 Hallucinations, unspecified: Secondary | ICD-10-CM | POA: Insufficient documentation

## 2013-05-07 DIAGNOSIS — Z5900 Homelessness unspecified: Secondary | ICD-10-CM | POA: Insufficient documentation

## 2013-05-07 DIAGNOSIS — Z87828 Personal history of other (healed) physical injury and trauma: Secondary | ICD-10-CM | POA: Insufficient documentation

## 2013-05-07 DIAGNOSIS — F29 Unspecified psychosis not due to a substance or known physiological condition: Secondary | ICD-10-CM | POA: Diagnosis present

## 2013-05-07 DIAGNOSIS — R440 Auditory hallucinations: Secondary | ICD-10-CM

## 2013-05-07 LAB — CBC
HCT: 39.5 % (ref 39.0–52.0)
Hemoglobin: 13.1 g/dL (ref 13.0–17.0)
MCH: 30.9 pg (ref 26.0–34.0)
MCHC: 33.2 g/dL (ref 30.0–36.0)

## 2013-05-07 LAB — RAPID URINE DRUG SCREEN, HOSP PERFORMED
Barbiturates: NOT DETECTED
Cocaine: NOT DETECTED
Opiates: NOT DETECTED

## 2013-05-07 LAB — COMPREHENSIVE METABOLIC PANEL
Alkaline Phosphatase: 73 U/L (ref 39–117)
BUN: 9 mg/dL (ref 6–23)
Calcium: 10 mg/dL (ref 8.4–10.5)
Creatinine, Ser: 0.51 mg/dL (ref 0.50–1.35)
GFR calc Af Amer: >90 mL/min (ref >90)
Glucose, Bld: 129 mg/dL — ABNORMAL HIGH (ref 70–99)
Total Protein: 8.7 g/dL — ABNORMAL HIGH (ref 6.0–8.3)

## 2013-05-07 LAB — ETHANOL: Alcohol, Ethyl (B): <11 mg/dL (ref 0–11)

## 2013-05-07 LAB — SALICYLATE LEVEL: Salicylate Lvl: <2 mg/dL — ABNORMAL LOW (ref 2.8–20.0)

## 2013-05-07 MED ORDER — ALUM & MAG HYDROXIDE-SIMETH 200-200-20 MG/5ML PO SUSP: 30.0000 mL | ORAL | Status: DC | PRN

## 2013-05-07 MED ORDER — ZOLPIDEM TARTRATE 5 MG PO TABS
5.0000 mg | ORAL_TABLET | Freq: Every evening | ORAL | Status: DC | PRN
  Administered 2013-05-09: 5 mg via ORAL
  Filled 2013-05-07: qty 1

## 2013-05-07 MED ORDER — ACETAMINOPHEN 325 MG PO TABS
650.0000 mg | ORAL_TABLET | ORAL | Status: DC | PRN
  Administered 2013-05-08 – 2013-05-15 (×8): 650 mg via ORAL
  Filled 2013-05-07 (×8): qty 2

## 2013-05-07 MED ORDER — IBUPROFEN 200 MG PO TABS
600.0000 mg | ORAL_TABLET | Freq: Three times a day (TID) | ORAL | Status: DC | PRN
  Administered 2013-05-13 – 2013-05-15 (×4): 600 mg via ORAL
  Filled 2013-05-07 (×6): qty 3

## 2013-05-07 MED ORDER — ONDANSETRON HCL 4 MG PO TABS: 4.0000 mg | ORAL_TABLET | Freq: Three times a day (TID) | ORAL | Status: DC | PRN
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
## 2013-05-07 NOTE — Progress Notes (Signed)
[facility] provided pt with a list of primary care resources, highlighting [facility] and [facility].
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
## 2013-05-07 NOTE — ED Notes (Signed)
Patient stated he wants to hurt himself. Thinks others want to harm him

## 2013-05-07 NOTE — ED Notes (Signed)
Dr. Freida Busman notified of pt's elevated BP. No orders received. Will continue to monitor pt's BP.

## 2013-05-07 NOTE — ED Notes (Signed)
Patient belongings in [place] locker 31

## 2013-05-08 ENCOUNTER — Encounter (HOSPITAL_COMMUNITY): Payer: Self-pay | Admitting: Registered Nurse

## 2013-05-08 DIAGNOSIS — F29 Unspecified psychosis not due to a substance or known physiological condition: Secondary | ICD-10-CM

## 2013-05-08 MED ORDER — LISINOPRIL 20 MG PO TABS
20.0000 mg | ORAL_TABLET | Freq: Once | ORAL | Status: AC
  Administered 2013-05-08: 20 mg via ORAL
  Filled 2013-05-08: qty 1

## 2013-05-08 MED ORDER — LORAZEPAM 2 MG/ML IJ SOLN
2.0000 mg | Freq: Once | INTRAMUSCULAR | Status: AC
  Administered 2013-05-09: 2 mg via INTRAMUSCULAR
  Filled 2013-05-08: qty 1

## 2013-05-08 MED ORDER — QUETIAPINE FUMARATE 300 MG PO TABS
300.0000 mg | ORAL_TABLET | Freq: Every day | ORAL | Status: DC
  Administered 2013-05-08: 300 mg via ORAL
  Filled 2013-05-08: qty 1

## 2013-05-08 MED ORDER — LORAZEPAM 1 MG PO TABS
1.0000 mg | ORAL_TABLET | Freq: Once | ORAL | Status: AC
  Administered 2013-05-08: 1 mg via ORAL
  Filled 2013-05-08: qty 1

## 2013-05-08 MED ORDER — LISINOPRIL 5 MG PO TABS
5.0000 mg | ORAL_TABLET | Freq: Every day | ORAL | Status: DC
  Administered 2013-05-09 – 2013-05-15 (×7): 5 mg via ORAL
  Filled 2013-05-08 (×7): qty 1

## 2013-05-08 NOTE — ED Provider Notes (Signed)
Medical screening examination/treatment/procedure(s) were performed by non-physician practitioner and as supervising physician I was immediately available for consultation/collaboration.  EKG Interpretation   None        Juliet Rude. Rubin Payor, MD 05/08/13 [phone]

## 2013-05-08 NOTE — ED Notes (Signed)
Notified EDP of pt.'s BP: 166/116 (manual).  Order for new medication.  Will continue to monitor pt.

## 2013-05-08 NOTE — ED Notes (Signed)
Patient appears confused, weak, trying to stand. Patient BP drop to 110/73 with pulse 122. Instructed patient on fall precautions, and changing positions slowly. EDP notified.

## 2013-05-08 NOTE — ED Notes (Signed)
Notified EDP of pt.'s B/P.  No new orders given, will continue to monitor pt.

## 2013-05-08 NOTE — ED Notes (Signed)
Pt walking down hall attempting to open doors. Pt begins urinating in hall as he is walking. Pt walked to bathroom and went back to room and sat on bed. Staff member went to his room and gave pt new scrubs and new socks and advised to change.

## 2013-05-08 NOTE — ED Provider Notes (Addendum)
6:14 PM Pt with persistently high BP in ED. Hx of same. Per review of old records has been on lisinopril 20mg  previously. Renal function fine. Orders for this entered.   Raeford Razor, MD 05/08/13 1817  11:27 PM Called by nursing. Tachycardic. BP significantly lower than has been, but not hypotensive. May have overshot BP. Rectal temp 99.1.  Pt assessed. Tachycardic consistent with recorded vitals. Lungs clear. Abdomen benign. Stood pt beside bed and was somewhat unsteady. Difficult to get good history from pt, but denies pain. Encouraged to drink water at bedside. Will continue to observe at this time.     Raeford Razor, MD 05/08/13 [phone]
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
## 2013-05-08 NOTE — Progress Notes (Signed)
Patient meets criteria for inpatient hospitalization, per Dr. Ladona Ridgel.   Incoming staff will refer to other hospitals.

## 2013-05-08 NOTE — Consult Note (Signed)
Note reviewed and agree with

## 2013-05-08 NOTE — ED Notes (Signed)
Pt dinner removed from room.  Pt refused to eat.  Pt within sight of RN at all times for safety.

## 2013-05-08 NOTE — ED Notes (Signed)
Patient BP improved to 143/97 at 2030. Patient received tylenol 1954 for headache, also improved. Patient reports SI/HI. Patient contracts for safety. Patient resting quietly in his room.
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
## 2013-05-08 NOTE — B[facility] Counselor (Addendum)
Writer was called by TTS Ava who states pt's RN told her that pt has been waiting for telepsych since yesterday 05/08/13. Writer checked TTS telepsych log and there is no record of telepsych order having been called in by [facility]. Writer called RN Tanika and scheduled TA for 8:15. Writer called and spoke w/ EDP Lynelle Doctor re: patient and notified EDP that pt's TA is scheduled.  Evette Cristal, [place] Assessment Counselor

## 2013-05-09 ENCOUNTER — Encounter (HOSPITAL_COMMUNITY): Payer: Self-pay | Admitting: Registered Nurse

## 2013-05-09 DIAGNOSIS — F22 Delusional disorders: Secondary | ICD-10-CM

## 2013-05-09 DIAGNOSIS — F411 Generalized anxiety disorder: Secondary | ICD-10-CM

## 2013-05-09 DIAGNOSIS — R443 Hallucinations, unspecified: Secondary | ICD-10-CM

## 2013-05-09 DIAGNOSIS — F329 Major depressive disorder, single episode, unspecified: Secondary | ICD-10-CM

## 2013-05-09 LAB — COMPREHENSIVE METABOLIC PANEL
Albumin: 3.9 g/dL (ref 3.5–5.2)
Alkaline Phosphatase: 61 U/L (ref 39–117)
BUN: 10 mg/dL (ref 6–23)
Calcium: 9.5 mg/dL (ref 8.4–10.5)
Creatinine, Ser: 0.59 mg/dL (ref 0.50–1.35)
GFR calc Af Amer: >90 mL/min (ref >90)
Glucose, Bld: 141 mg/dL — ABNORMAL HIGH (ref 70–99)
Potassium: 3.2 mEq/L — ABNORMAL LOW (ref 3.5–5.1)
Total Protein: 7.8 g/dL (ref 6.0–8.3)

## 2013-05-09 LAB — CBC WITH DIFFERENTIAL/PLATELET
Basophils Absolute: 0 10*3/uL (ref 0.0–0.1)
Basophils Relative: 0 % (ref 0–1)
Eosinophils Absolute: 0.1 10*3/uL (ref 0.0–0.7)
Eosinophils Relative: 1 % (ref 0–5)
Hemoglobin: 13.3 g/dL (ref 13.0–17.0)
MCH: 31.1 pg (ref 26.0–34.0)
MCHC: 34 g/dL (ref 30.0–36.0)
MCV: 91.6 fL (ref 78.0–100.0)
Monocytes Absolute: 0.6 10*3/uL (ref 0.1–1.0)
Monocytes Relative: 10 % (ref 3–12)
Neutrophils Relative %: 66 % (ref 43–77)

## 2013-05-09 MED ORDER — LORAZEPAM 1 MG PO TABS
0.0000 mg | ORAL_TABLET | Freq: Two times a day (BID) | ORAL | Status: AC
  Administered 2013-05-12: 1 mg via ORAL
  Filled 2013-05-09 (×2): qty 1

## 2013-05-09 MED ORDER — QUETIAPINE FUMARATE 300 MG PO TABS
300.0000 mg | ORAL_TABLET | Freq: Two times a day (BID) | ORAL | Status: DC
  Administered 2013-05-09 – 2013-05-15 (×11): 300 mg via ORAL
  Filled 2013-05-09 (×12): qty 1

## 2013-05-09 MED ORDER — CLONIDINE HCL 0.1 MG PO TABS
0.2000 mg | ORAL_TABLET | Freq: Once | ORAL | Status: AC
  Administered 2013-05-09: 0.2 mg via ORAL
  Filled 2013-05-09: qty 2

## 2013-05-09 MED ORDER — LORAZEPAM 1 MG PO TABS
0.0000 mg | ORAL_TABLET | Freq: Four times a day (QID) | ORAL | Status: AC
  Administered 2013-05-09 (×2): 1 mg via ORAL
  Administered 2013-05-10: 2 mg via ORAL
  Administered 2013-05-11: 1 mg via ORAL
  Filled 2013-05-09: qty 1
  Filled 2013-05-09: qty 2
  Filled 2013-05-09 (×2): qty 1

## 2013-05-09 MED ORDER — SODIUM CHLORIDE 0.9 % IV SOLN
Freq: Once | INTRAVENOUS | Status: AC
  Administered 2013-05-09: 01:00:00 via INTRAVENOUS

## 2013-05-09 MED ORDER — ZIPRASIDONE MESYLATE 20 MG IM SOLR
10.0000 mg | Freq: Once | INTRAMUSCULAR | Status: AC
  Administered 2013-05-09: 10 mg via INTRAMUSCULAR
  Filled 2013-05-09: qty 20

## 2013-05-09 NOTE — ED Notes (Signed)
Pt assisted with breakfast.  Pt ate 10% of meal before refusing.

## 2013-05-09 NOTE — ED Notes (Signed)
Pt ate 100% of meal.

## 2013-05-09 NOTE — Progress Notes (Signed)
Pt remains confused, needing moderate amount of redirecting. Pt in restraints with attempts to get out of bed.  Pt remains unable to provide pcp information to CM Pt previously given information by [facility] staff on 05/07/13

## 2013-05-09 NOTE — Consult Note (Signed)
  Subjective:    Patient continues to hallucinate and feel that there is a tribe waiting outside to kill him.  Patient is calm and relaxed.  Patient in restraints related to wondering not behavioral problems.  Psychiatric Specialty Exam: Physical Exam  ROS  Blood pressure 128/96, pulse 104, temperature 97.3 F (36.3 C), temperature source Axillary, resp. rate 20, SpO2 100.00%.There is no height or weight on file to calculate BMI.  General Appearance: Casual  Eye Contact::  Fair  Speech:  Slow and difficult to understand related to accent  Volume:  Decreased  Mood:  Anxious, Depressed and Hopeless  Affect:  Blunt, Depressed and Flat  Thought Process:  Circumstantial  Orientation:  Full (Time, Place, and Person)  Thought Content:  Hallucinations: Auditory Visual and Paranoid Ideation  Suicidal Thoughts:  No  Homicidal Thoughts:  No  Memory:  Immediate;   Fair Recent;   Fair  Judgement:  Impaired  Insight:  Lacking  Psychomotor Activity:  Normal  Concentration:  Fair  Recall:  Fair  Akathisia:  No  Handed:  Right  AIMS (if indicated):     Assets:  Desire for Improvement  Sleep:      Face to face interview/consult with Dr. Ladona Ridgel  Disposition:  Will continue with current plan for inpatient treatment.  Patient is [facility] for [facility] at [facility] when bed is available.  Will discuss with AC.  Continue to look for inpatient placement at other facilities.  Monitor for safety and stabilization until inpatient treatment bed is found.  Alfredo Collymore B. Gelila Well FNP-BC Family Nurse Practitioner, Board Certified

## 2013-05-09 NOTE — ED Notes (Signed)
Patient remain very agitated, non compliant, high fall risk- attempting to stand in the bed to get out of bed . Reoriented to policy and rules. Very impulsive behavior at this time. Patient cont to not follow instruction. Will make attending doctor aware.  Will cont to monitor.

## 2013-05-09 NOTE — ED Notes (Signed)
Patient belongings are in [place] # 09

## 2013-05-09 NOTE — ED Notes (Signed)
Unable to get pt temp

## 2013-05-09 NOTE — BHH Counselor (Signed)
Writer faxed referral to [facility]. Per Tamika at [facility], they have "one or two beds" available.  Evette Cristal, [place] Assessment Counselor

## 2013-05-10 NOTE — Progress Notes (Signed)
Referred to [facility]. Incoming staff will follow up on referral

## 2013-05-10 NOTE — ED Notes (Signed)
Nurse and Clinical research associate took patient out of wrist restraints to ambulate patient to restroom. Patient was complaining of pain in right knee, and could not bend that leg. Patient was weak and needed 2 assist to get to restroom. Patient received a bath while in restroom than nurse and writer assisted patient to sit in recliner in room. Patient has been left out of restraints. Patient was calm and cooperative while nurse and Clinical research associate assisted patient to the restroom and to the recliner.

## 2013-05-10 NOTE — Progress Notes (Signed)
[name] reports that she will re run the patient for consideration at [facility].

## 2013-05-10 NOTE — ED Notes (Signed)
Charge talked to [facility] at [facility], pt declined from several places, still working on placement

## 2013-05-10 NOTE — ED Notes (Signed)
Nurse and I assisted patient to ambulate around TCU unit. Patient was stronger and not as weak. Patient is now resting in his recliner.

## 2013-05-10 NOTE — Progress Notes (Addendum)
CSW spoke with Joni Reining at [facility].  Pt declined due to behavioral acuity.  Marva Panda, Theresia Majors  [phone]  .05/10/2013 8:14 pm  Pt referred to [facility]. Pt referred to [facility].    Marva Panda, Theresia Majors  [phone]  05/10/2013

## 2013-05-10 NOTE — Consult Note (Signed)
  Psychiatric Specialty Exam: Physical Exam  ROS  Blood pressure 116/84, pulse 87, temperature 97.4 F (36.3 C), temperature source Oral, resp. rate 18, SpO2 100.00%.There is no height or weight on file to calculate BMI.  General Appearance: Disheveled  Eye Contact::  Absent  Speech:  Slurred  Volume:  Decreased  Mood:  falling asleep  Affect:  Flat  Thought Process:  minimal  Orientation:  Not able to determine today  Thought Content:  Delusions and Hallucinations: Auditory Visual  Suicidal Thoughts:  No  Homicidal Thoughts:  No  Memory:  Immediate;   Poor Recent;   Poor Remote;   Poor  Judgement:  Impaired  Insight:  Lacking  Psychomotor Activity:  Normal  Concentration:  Poor  Recall:  Poor  Akathisia:  Negative  Handed:  Right  AIMS (if indicated):     Assets:  Others:  currently psychotic  Sleep:   poor   Mr Barnett is currently sedated and falling asleep due to Seroquel dose last night.  Still awaiting a bed when one is available.

## 2013-05-11 ENCOUNTER — Emergency Department (HOSPITAL_COMMUNITY): Payer: Self-pay

## 2013-05-11 NOTE — Progress Notes (Signed)
[facility]: Spoke with Natackia @ 0140 No Beds Are Available. [facility]: Spoke with Staff @ [phone], have not had a chance to review any referrals but will be reviewing referrals soon.  Trevor Blackwell, MHT

## 2013-05-11 NOTE — Progress Notes (Addendum)
CSW refaxed referral to [facility] for review. Per Paula Compton due to unit acuity no admissions at this time.   Pt referred to [facility], however no appropriate beds available for pt at this time.  Pt pending reivew at [facility].   Catha Gosselin, LCSW [phone]  ED CSW .05/11/2013 1109am

## 2013-05-11 NOTE — ED Notes (Signed)
Got pt up out of the chair and assisted him to the bed with the NT, pt stated that he could not bend his right knee and that it was due to his fall. There has not been any report of pt falling here at the hospital and pt did not confirm as to how recent or old that he actually fell. Pt kept his leg straight and had to have two people assist due to him not bending his leg and from him being weak. MD is aware.

## 2013-05-11 NOTE — Progress Notes (Signed)
Patient ID: Trevor Blackwell, male   DOB: [birth year], 42 y.o.   MRN: [id number] Psychiatric Specialty Exam: Physical Exam  ROS  Blood pressure 115/67, pulse 83, temperature 97.4 F (36.3 C), temperature source Oral, resp. rate 18, SpO2 100.00%.There is no height or weight on file to calculate BMI.  General Appearance: Casual and Disheveled  Eye Contact::  None  Speech:  Slow  Volume:  Decreased  Mood:  Anxious, Depressed, Dysphoric and Hopeless  Affect:  Congruent, Constricted, Depressed and Flat  Thought Process:  Disorganized  Orientation:  Full (Time, Place, and Person)  Thought Content:  Hallucinations: Visual, Paranoid Ideation and SEEING PEOPLE FROM HIS COUNTRY TRYING TO KILL HIM  Suicidal Thoughts:  Yes.  with intent/plan  Homicidal Thoughts:  No  Memory:  Immediate;   Fair Recent;   Fair Remote;   Fair  Judgement:  Poor  Insight:  Shallow  Psychomotor Activity:  Decreased  Concentration:  Fair  Recall:  NA  Akathisia:  NA  Handed:  Right  AIMS (if indicated):     Assets:  Desire for Improvement  Sleep:      Saw patient today with Dr Tawni Carnes at his bedside.  Patient had his eye closed through out the entire assessment.  Patient is still paranoid and afraid that members from another tribe in his country are coming to kill him in the hospital.  Patient had an xray of his knees today because he is unable to walk as he has been when he came in.   Result of xray was significant to two metal pins lose and there was no frature  We will continue to seek for placement to other facilities as we at this time do not have bed.  We will continue to offer him his medications and continue to monitor patient.  Dahlia Byes   PMHNP-BC

## 2013-05-11 NOTE — Progress Notes (Signed)
Per discussion with nurse, patient have right knee pain, and a 2 person assist to the bathroom. Pt pending further medical evaluation at this time.   Catha Gosselin, LCSW [phone]  ED CSW .05/11/2013 11:14am

## 2013-05-11 NOTE — Progress Notes (Addendum)
CSW faxed referral to [facility] and completed verbal intake with Robinette.   Confirmed with Vonna Kotyk that fax was received.Pt information pending review.  Marva Panda, Theresia Majors  [phone]  .05/11/2013  4:45 pm  Referred to [facility].  Declined  .Karle Plumber  [phone]  .05/11/2013'

## 2013-05-11 NOTE — ED Provider Notes (Addendum)
Per RN, the patient is complaining of leg pain.  XR demonstrates posterior effusion, and two screws that are not fully engaged.  This finding is consistent with XR since 2010.  Gerhard Munch, MD 05/11/13 1132  (Late addendum) - after reviewing the patient's xr, I went to check on him.  He was sleeping, in no distress.  With his documented Hx of loose screws, this can be evaluated on a non-emergent basis.   Gerhard Munch, MD 05/11/13 1550

## 2013-05-12 LAB — URINALYSIS, ROUTINE W REFLEX MICROSCOPIC
Bilirubin Urine: NEGATIVE
Glucose, UA: NEGATIVE mg/dL
Hgb urine dipstick: NEGATIVE
Leukocytes, UA: NEGATIVE
Protein, ur: NEGATIVE mg/dL
pH: 5.5 (ref 5.0–8.0)

## 2013-05-12 NOTE — Progress Notes (Signed)
CSW called [facility] and spoke with Tresa Endo. CSW requested uric acid test so that placement process can progress with [facility]. Tresa Endo stated she will speak with RN to order test.  Mariann Laster, [phone]     ED CSW
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
## 2013-05-12 NOTE — ED Notes (Signed)
Pt ate 100% of his dinner   

## 2013-05-12 NOTE — Progress Notes (Addendum)
Magistrate Catha Nottingham confirmed that IVC papers are processed and sheriff will dispatch.  York Spaniel [place], [phone]     ED CSW  11:34am _________________________________  Louanne Belton confirmed receipt of IVC paperwork. CSW awaiting call to confirm sheriff has been dispatched.  York Spaniel Wishram, [phone]     ED CSW  11:18am

## 2013-05-12 NOTE — ED Notes (Signed)
Patient unable to be transferred in room in psych due to mobility limitation at this time. See previous notes.

## 2013-05-12 NOTE — ED Provider Notes (Signed)
Asked by psychiatry to evaluate patient for right knee pain. 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 He has been complaining of pain. Examination reveals no swelling, no erythema, no warmth. Extra images reviewed - hardware unchanged (included two loose screws ) from previous. Exam is not consistent with septic joint, or infection of any kind. Uric acid level obtained, but doubt Gout. This is a chronic problem and should not interfere with psychiatric treatment, or preclude inpatient treatment. Pain is chronic in nature and XRay findings do not warrant ortho evaluation or surgery. Await uric acid level.  Filed Vitals:   05/12/13 0612  BP: 119/77  Pulse: 86  Temp: 98.3 F (36.8 C)  Resp: 18     Gilda Crease, MD 05/12/13 [phone]

## 2013-05-12 NOTE — ED Notes (Signed)
Pt ate 100% of his breakfast.

## 2013-05-12 NOTE — ED Notes (Signed)
Bed: ZO10 Expected date:  Expected time:  Means of arrival:  Comments: Tcu hold

## 2013-05-12 NOTE — ED Notes (Signed)
ED tried to send this pt over from TCU to the psych ed, however, all the notes indicate pt cannot walk alone due to loose screws in his right knee per x-ray thus pt is a two person assist with ambulation. Let TCU RN Pam know that pt doesn't meet criteria to come to the psych ed due to being unable to ambulate alone. She agreed with this RN's decision and said they need her and a tech from TCU to go upstairs to work. She will let charge RN know. Writer will call [facility] AC just to keep her updated.

## 2013-05-13 ENCOUNTER — Encounter (HOSPITAL_COMMUNITY): Payer: Self-pay | Admitting: Registered Nurse

## 2013-05-13 NOTE — ED Notes (Signed)
Pt ate 25% of his dinner. 

## 2013-05-13 NOTE — ED Notes (Signed)
Pt sister called and is speaking with pt. Sitter at bedside

## 2013-05-13 NOTE — Progress Notes (Signed)
Contacted the following facilities to follow up on patient referral:  [facility]- Per Shanda Bumps, at capacity [facility]- Per [facility], pt was denied due to acuity  Rodman Pickle, MHT

## 2013-05-13 NOTE — ED Notes (Signed)
Pt ate 25% of his breakfast. 

## 2013-05-13 NOTE — Consult Note (Signed)
[place], IVC and Vitals faxed to [place] at [facility] for review

## 2013-05-13 NOTE — ED Notes (Signed)
Patient is resting comfortably. Sitter at bedside. Pt has even, unlabored resp
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
## 2013-05-13 NOTE — ED Provider Notes (Signed)
Pt seen by ortho.  Ortho sees no reason that the pt cannot walk.  No ortho treatment needed for now.    Benny Lennert, MD 05/13/13 [phone]

## 2013-05-13 NOTE — ED Notes (Signed)
Pt ate 25% of his lunch

## 2013-05-13 NOTE — Consult Note (Signed)
    Subjective:    Patient states that he doesn't know how he is doing. Patient states that he came to the hospital because he was having a problem with leg. Patient states that he is taking medication and sometimes they help and other times they do not.  Patient denies that he is hearing voices at this time.  Patient states that he lives on the street and has been homeless for two years.  "My family is at home in [place]."  Patient states that he has no prior psych history and has never been on psych medication.  Patient states that he was hospitalized in 2009 for his leg "I broke my knee."   Patient lying in bed during consult.  Was informed by Tech. That patient has been unable to stand and weak with ambulation. When patient using bathroom is unable to stand with out leg bending and patient falling.    Psychiatric Specialty Exam: Physical Exam  ROS  Blood pressure 152/90, pulse 85, temperature 98.2 F (36.8 C), temperature source Oral, resp. rate 16, SpO2 100.00%.There is no height or weight on file to calculate BMI.  General Appearance: Casual  Eye Contact::  Fair  Speech:  Slow and difficult to understand related to accent  Volume:  Decreased  Mood:  Anxious, Depressed and Hopeless  Affect:  Blunt, Depressed and Flat  Thought Process:  Circumstantial  Orientation:  Full (Time, Place, and Person)  Thought Content:  Hallucinations: Auditory Visual and Paranoid Ideation  Suicidal Thoughts:  No  Homicidal Thoughts:  No  Memory:  Immediate;   Fair Recent;   Fair  Judgement:  Impaired  Insight:  Lacking  Psychomotor Activity:  Normal  Concentration:  Fair  Recall:  Fair  Akathisia:  No  Handed:  Right  AIMS (if indicated):     Assets:  Desire for Improvement  Sleep:      Face to face interview/consult with Dr. Lolly Mustache  Disposition:  Will continue current treatment plan for inpatient treatment.  Patient will need to be medically treated with the swelling and tenderness in his leg and  why the patient is unable to bear weight or walk before a inpatient psychiatric inpatient facility will take.  Continue to monitor for safety and stabilization.  Shuvon B. Rankin FNP-BC Family Nurse Practitioner, Board Certified  I have personally seen the patient and agreed with the findings and involved in the treatment plan. Kathryne Sharper, MD

## 2013-05-14 LAB — COMPREHENSIVE METABOLIC PANEL
ALT: 19 U/L (ref 0–53)
AST: 22 U/L (ref 0–37)
Alkaline Phosphatase: 55 U/L (ref 39–117)
CO2: 29 mEq/L (ref 19–32)
Calcium: 10 mg/dL (ref 8.4–10.5)
Chloride: 101 mEq/L (ref 96–112)
GFR calc Af Amer: >90 mL/min (ref >90)
GFR calc non Af Amer: >90 mL/min (ref >90)
Glucose, Bld: 95 mg/dL (ref 70–99)
Sodium: 139 mEq/L (ref 135–145)
Total Bilirubin: 0.4 mg/dL (ref 0.3–1.2)

## 2013-05-14 NOTE — Progress Notes (Signed)
CSW confirmed with Junious Dresser that patient is still pending review, Dr. Allena Katz to review pt information today. CSW awaiting to hear back from [facility].   Catha Gosselin, LCSW [phone]  ED CSW .05/14/2013 931am

## 2013-05-14 NOTE — ED Notes (Signed)
Ambulated to TCU without assistance and used the restroom without assistance. He is calm and cooperative at present without hallucinations.

## 2013-05-14 NOTE — Progress Notes (Signed)
Patient ID: Trevor Blackwell, male   DOB: Oct 19, 1970, 42 y.o.   MRN: [id number] Psychiatric Specialty Exam: Physical Exam  ROS  Blood pressure 115/75, pulse 59, temperature 98.9 F (37.2 C), temperature source Oral, resp. rate 19, SpO2 99.00%.There is no height or weight on file to calculate BMI.  General Appearance: Casual  Eye Contact::  Fair  Speech:  Clear and Coherent and Normal Rate  Volume:  Normal  Mood:  Anxious and Depressed  Affect:  Congruent, Depressed and Flat  Thought Process:  Coherent  Orientation:  Full (Time, Place, and Person)  Thought Content:  SUICIDAL IDEATION  Suicidal Thoughts:  Yes.  without intent/plan  Homicidal Thoughts:  No  Memory:  Immediate;   Fair Recent;   Fair Remote;   Fair  Judgement:  Impaired  Insight:  Shallow  Psychomotor Activity:  Decreased  Concentration:  Fair  Recall:  NA  Akathisia:  NA  Handed:  Right  AIMS (if indicated):     Assets:  Desire for Improvement Housing Vocational/Educational  Sleep:      Evaluated by this Clinical research associate and DR Tawni Carnes this am.  Patient was more verbal and stated he is still suicidal.  Patient also asked for some medication for knee pain.  However, patient walked short distance by himself when he was moved to a new room.  We will continue to monitor patient while we wait for acceptance at [facility].   Plan:  Continue current plan of care.  Dahlia Byes  PMHNP-BC

## 2013-05-14 NOTE — Progress Notes (Signed)
Underwriter contacted Pam at [facility] on pt status.  Faxed UA and current vitals for pt to be added to waitlist.  Awaiting a [facility] callback to confirm pt on waitlist.  [name], MHT/NS

## 2013-05-14 NOTE — Progress Notes (Signed)
CSW attempted several occasions to discuss patient with A/C and charge nurse regarding patient disposition.   Catha Gosselin, LCSW [phone]  ED CSW .05/14/2013 1359pm

## 2013-05-14 NOTE — Progress Notes (Signed)
Pt was seen with NP. Agree with above assessment and plan. 

## 2013-05-14 NOTE — Progress Notes (Signed)
MHT spoke with [name] after faxing UA and vital signs.  [facility] request a orthopedic consult on pt legs prior to proceeding by adding him on the wait list.  Spoke with [name] who stated completed on 05/13/13 and faxed into [facility] for review.  [name], MHT/NS

## 2013-05-14 NOTE — Progress Notes (Addendum)
[facility] requested a CMP.  CSW faxed CMP results to Erskine Squibb at [facility] for review.    Marva Panda, Theresia Majors  [phone]  .05/14/2013  [facility] requested a copy of patient's legal paperwork.  CSW faxed IVC paperwork to Jane at [facility].  Per Erskine Squibb at [facility], pt has been reviewed by medical and is now on the [facility] wait list.  .Marva Panda, LCSWA  [phone]  .05/14/2013  9:45 pm

## 2013-05-14 NOTE — Progress Notes (Signed)
Pt was interviewed with NP. Agree with above assessment and plan. 

## 2013-05-15 ENCOUNTER — Encounter (HOSPITAL_COMMUNITY): Payer: Self-pay | Admitting: *Deleted

## 2013-05-15 ENCOUNTER — Inpatient Hospital Stay (HOSPITAL_COMMUNITY)
Admission: AD | Admit: 2013-05-15 | Discharge: 2013-05-24 | DRG: 885 | Disposition: A | Discharge status: Home / Self Care | Payer: Federal, State, Local not specified - Other | Source: Intra-hospital | Attending: Psychiatry | Admitting: Psychiatry

## 2013-05-15 DIAGNOSIS — R45851 Suicidal ideations: Secondary | ICD-10-CM

## 2013-05-15 DIAGNOSIS — F411 Generalized anxiety disorder: Secondary | ICD-10-CM | POA: Diagnosis present

## 2013-05-15 DIAGNOSIS — F1010 Alcohol abuse, uncomplicated: Secondary | ICD-10-CM | POA: Diagnosis present

## 2013-05-15 DIAGNOSIS — F29 Unspecified psychosis not due to a substance or known physiological condition: Secondary | ICD-10-CM

## 2013-05-15 DIAGNOSIS — F1020 Alcohol dependence, uncomplicated: Secondary | ICD-10-CM

## 2013-05-15 DIAGNOSIS — Z5900 Homelessness unspecified: Secondary | ICD-10-CM

## 2013-05-15 DIAGNOSIS — F22 Delusional disorders: Secondary | ICD-10-CM | POA: Diagnosis present

## 2013-05-15 DIAGNOSIS — F4310 Post-traumatic stress disorder, unspecified: Secondary | ICD-10-CM | POA: Diagnosis present

## 2013-05-15 DIAGNOSIS — F333 Major depressive disorder, recurrent, severe with psychotic symptoms: Principal | ICD-10-CM | POA: Diagnosis present

## 2013-05-15 DIAGNOSIS — I10 Essential (primary) hypertension: Secondary | ICD-10-CM | POA: Diagnosis present

## 2013-05-15 DIAGNOSIS — Z23 Encounter for immunization: Secondary | ICD-10-CM

## 2013-05-15 HISTORY — DX: Essential (primary) hypertension: I10

## 2013-05-15 MED ORDER — ALUM & MAG HYDROXIDE-SIMETH 200-200-20 MG/5ML PO SUSP: 30.0000 mL | ORAL | Status: DC | PRN

## 2013-05-15 MED ORDER — TRAZODONE HCL 50 MG PO TABS
50.0000 mg | ORAL_TABLET | Freq: Every evening | ORAL | Status: DC | PRN
  Administered 2013-05-16: 50 mg via ORAL
  Filled 2013-05-15: qty 14
  Filled 2013-05-15: qty 1

## 2013-05-15 MED ORDER — ACETAMINOPHEN 325 MG PO TABS
650.0000 mg | ORAL_TABLET | Freq: Four times a day (QID) | ORAL | Status: DC | PRN
  Administered 2013-05-15 – 2013-05-19 (×4): 650 mg via ORAL
  Filled 2013-05-15 (×3): qty 2

## 2013-05-15 MED ORDER — MAGNESIUM HYDROXIDE 400 MG/5ML PO SUSP: 30.0000 mL | Freq: Every day | ORAL | Status: DC | PRN

## 2013-05-15 MED ORDER — INFLUENZA VAC SPLIT QUAD 0.5 ML IM SUSP
0.5000 mL | INTRAMUSCULAR | Status: AC
  Administered 2013-05-16: 0.5 mL via INTRAMUSCULAR
  Filled 2013-05-15: qty 0.5

## 2013-05-15 MED ORDER — LISINOPRIL 5 MG PO TABS
5.0000 mg | ORAL_TABLET | Freq: Every day | ORAL | Status: DC
  Administered 2013-05-16: 5 mg via ORAL
  Filled 2013-05-15 (×3): qty 1

## 2013-05-15 MED ORDER — QUETIAPINE FUMARATE 300 MG PO TABS
300.0000 mg | ORAL_TABLET | Freq: Two times a day (BID) | ORAL | Status: DC
  Administered 2013-05-15 – 2013-05-16 (×2): 300 mg via ORAL
  Filled 2013-05-15 (×6): qty 1

## 2013-05-15 NOTE — ED Notes (Signed)
Pt tx to [facility] by gpd

## 2013-05-15 NOTE — Progress Notes (Signed)
[facility] ED Cm spoke with EDP, Campos about [facility] recommendation for Ortho tech DME to assist with his short leg gait issues to decrease his pain Pending orders

## 2013-05-15 NOTE — Consult Note (Signed)
Note reviewed and agreed with  

## 2013-05-15 NOTE — BH Assessment (Signed)
Patient accepted by MD Ladona Ridgel to [facility]. Attending MD Akintayo, bed assigned [id number]. [facility] notified.

## 2013-05-15 NOTE — ED Notes (Signed)
Patient resting in position of comfort with eyes closed s/p admin of Motrin, see MAR RR WNL--even and unlabored with equal rise and fall of chest Patient in NAD Side rails up, call bell in reach

## 2013-05-15 NOTE — ED Notes (Signed)
Patient awake with c/o right knee pain--requesting pain medication Patient medicated with Motrin 600 mg, see MAR Scanner in several rooms not working, unable to scan patient and medication (will make IT and charge nurse aware of issue) Patient denied further needs at this time Side rails up, call bell in reach and sitter at bedside Patient in NAD

## 2013-05-15 NOTE — ED Notes (Signed)
Patient resting in position of comfort with eyes closed RR WNL--even and unlabored with equal rise and fall of chest Patient in NAD Side rails up, call bell in reach  

## 2013-05-15 NOTE — ED Notes (Signed)
Gave report to [place] rn at [employer], sheriff called for tx

## 2013-05-15 NOTE — BH Assessment (Signed)
[facility]HH Assessment Progress Note   PT initially declined by [facility] due to inability to walk and orthopedic issues.  However, pt began walking independently again today and request was made by Baxter Hire, CSW, to have patient re run for consideration at [facility].  Donell Sievert, [facility] extender accepted pt pending 400 hall bed.

## 2013-05-15 NOTE — Progress Notes (Signed)
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  In the past the patient has used a specialized shoe.

## 2013-05-15 NOTE — BHH Group Notes (Signed)
Adult Psychoeducational Group Note  Date:  05/15/2013 Time:  9:23 PM  Group Topic/Focus:  Wrap-Up Group:   The focus of this group is to help patients review their daily goal of treatment and discuss progress on daily workbooks.  Participation Level:  Did Not Attend  Participation Quality:  None  Affect:  None  Cognitive:  None  Insight: None  Engagement in Group:  None  Modes of Intervention:  Discussion  Additional Comments:  Trevor Blackwell did not attend group.  [name] A 05/15/2013, 9:23 PM

## 2013-05-15 NOTE — Progress Notes (Signed)
After the length of stay meeting Dr. Geoffry Paradise (Medical Advisor) wants to know the status of the patient receiving a bed since he was accepted by Donell Sievert, PA on 05-12-2013.   Writer spoke with Dorann Lodge [place] [phone]) a representative from ([employer] [place]).  Dorann Lodge reports that she is a retired Clinical research associate and she worked with the patient in the past when he was being sponsored by [employer].  At present, the church is no longer worker with the patient since he lost his job at [employer].    [place] reports that he has another support and his name is Adria Devon, DDS).  Juanita did not have a phone number for him.  Dorann Lodge reports that she was able to locate and link the patient with another [place] Refuge[employer] that is willing to allow the patient to come and live with him in [place] once the patient is psychiatrically stable.    Dr. Ladona Ridgel reports that the patient is not psychiatrically stable.   Juanita reports that one leg is shorter than the other.  Writer will follow up on the patient receiving a consult for Physical Therapy.

## 2013-05-15 NOTE — ED Notes (Signed)
Assumed care of patient s/p report Patient resting in position of comfort with eyes closed RR WNL--even and unlabored with equal rise and fall of chest Patient in NAD Side rails up, call bell in reach  

## 2013-05-15 NOTE — Consult Note (Signed)
  Psychiatric Specialty Exam: Physical Exam  ROS  Blood pressure 130/92, pulse 77, temperature 99 F (37.2 C), temperature source Oral, resp. rate 18, SpO2 100.00%.There is no height or weight on file to calculate BMI.  General Appearance: Casual  Eye Contact::  Good  Speech:  Clear and Coherent  Volume:  Decreased  Mood:  Anxious  Affect:  Congruent  Thought Process:  Coherent  Orientation:  Full (Time, Place, and Person)  Thought Content:  Delusions and Hallucinations: Auditory  Suicidal Thoughts:  No  Homicidal Thoughts:  No  Memory:  Immediate;   Good Recent;   Good Remote;   Good  Judgement:  Impaired  Insight:  Lacking  Psychomotor Activity:  he is supposed to be wearing a shoe to compensate for one leg that is significantly shorter than the other.  Says his leg hurts more some days  than others  Concentration:  Good  Recall:  Good  Akathisia:  Negative  Handed:  Right  AIMS (if indicated):   0  Assets:  Communication Skills  Sleep:   good  Mr Choyce looks better today.  He is not restrained.  He walks though with difficulty.  He makes good eye contact.  No longer sees the tribe people outside his room.  Still hears them talking in the distance.  Still awaiting an inpatient bed.  One of his workers came to visit him and said he was one of the [employer] and she has been working with him since he came to this country.  There is another [employer] living  In [place] who is willing to have him live with him once he is clear.
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
## 2013-05-15 NOTE — ED Notes (Addendum)
Pt belongings were inventoried on belongings sheet located in shadow chart. Pt belongings secured in locker #35

## 2013-05-15 NOTE — Progress Notes (Addendum)
D: Patient awake in bed on approach.  Patient states he feels ok except he has pain in his right knee.  Patient states he had surgery on his knee back in 2009.  Patient states he has been living on the streets.  Patient stats he has been using his walker to get around.  Patient states he has passive SI but verbally contracts for safety.  Patient denies HI and denies AVH. A: Staff to monitor Q 15 mins for safety.  Encouragement and support offered.  No scheduled medications administered per orders. R: Patient remains safe on the unit.  Patient did not attend group tonight.  Patient not visible on the unit tonight.

## 2013-05-15 NOTE — Progress Notes (Signed)
Writer introduced self to pt. Pt c/o right knee pain. Pt given prn tylenol for pain. Pt also received scheduled meds at 1700. When asked if pt is SI, pt stated, "Not now". When asked if pt hear voices, pt stated, " I was". Pt denies any HI or VH. Pt in bed resting at this time. Pt safety maintained.

## 2013-05-15 NOTE — Tx Team (Signed)
Initial Interdisciplinary Treatment Plan  PATIENT STRENGTHS: (choose at least two) Ability for insight General fund of knowledge  PATIENT STRESSORS: Legal issue Occupational concerns Substance abuse   PROBLEM LIST: Problem List/Patient Goals Date to be addressed Date deferred Reason deferred Estimated date of resolution  SI  05/15/13     Voices  05/15/13                                                DISCHARGE CRITERIA:  Ability to meet basic life and health needs Improved stabilization in mood, thinking, and/or behavior Need for constant or close observation no longer present Reduction of life-threatening or endangering symptoms to within safe limits Safe-care adequate arrangements made Verbal commitment to aftercare and medication compliance  PRELIMINARY DISCHARGE PLAN: Outpatient therapy Return to previous living arrangement  PATIENT/FAMIILY INVOLVEMENT: This treatment plan has been presented to and reviewed with the patient, Trevor Blackwell, and/or family member,   The patient and family have been given the opportunity to ask questions and make suggestions.  Beatrix Shipper 05/15/2013, 4:22 PM

## 2013-05-16 DIAGNOSIS — F333 Major depressive disorder, recurrent, severe with psychotic symptoms: Principal | ICD-10-CM | POA: Diagnosis present

## 2013-05-16 DIAGNOSIS — F4310 Post-traumatic stress disorder, unspecified: Secondary | ICD-10-CM | POA: Diagnosis present

## 2013-05-16 DIAGNOSIS — R45851 Suicidal ideations: Secondary | ICD-10-CM

## 2013-05-16 MED ORDER — HALOPERIDOL 2 MG PO TABS
2.0000 mg | ORAL_TABLET | Freq: Every day | ORAL | Status: DC
  Administered 2013-05-16: 2 mg via ORAL
  Filled 2013-05-16 (×4): qty 1

## 2013-05-16 MED ORDER — NAPROXEN 375 MG PO TABS
375.0000 mg | ORAL_TABLET | Freq: Two times a day (BID) | ORAL | Status: DC
  Administered 2013-05-16 – 2013-05-24 (×16): 375 mg via ORAL
  Filled 2013-05-16 (×20): qty 1

## 2013-05-16 MED ORDER — CITALOPRAM HYDROBROMIDE 10 MG PO TABS
10.0000 mg | ORAL_TABLET | Freq: Every day | ORAL | Status: DC
  Administered 2013-05-16 – 2013-05-17 (×2): 10 mg via ORAL
  Filled 2013-05-16 (×4): qty 1

## 2013-05-16 MED ORDER — LISINOPRIL 10 MG PO TABS
10.0000 mg | ORAL_TABLET | Freq: Every day | ORAL | Status: DC
  Administered 2013-05-17 – 2013-05-24 (×8): 10 mg via ORAL
  Filled 2013-05-16 (×10): qty 1

## 2013-05-16 MED ORDER — ENSURE COMPLETE PO LIQD
237.0000 mL | Freq: Two times a day (BID) | ORAL | Status: DC
  Administered 2013-05-16 – 2013-05-24 (×14): 237 mL via ORAL
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
## 2013-05-16 NOTE — BHH Group Notes (Signed)
[facility] LCSW Aftercare Discharge Planning Group Note   05/16/2013 8:20 AM  Participation Quality:  Engaged  Mood/Affect:  Flat  Depression Rating:  8  Anxiety Rating:  0  Thoughts of Suicide:  No Will you contract for safety?   NA  Current AVH:  Yes  Plan for Discharge/Comments:  Trevor Blackwell [place], and there are voices in my head-something is wrong."  Denies that his has ever happened before.  Homeless; living in a tent.  Drinks 120 oz beers provided by friend 3X weekly.  Has been in [place] for 14 years.  Was living [place] to move here.  Transportation Means: bus  Supports: tent neighbors  [place], [place]

## 2013-05-16 NOTE — Progress Notes (Signed)
Recreation Therapy Notes  Date: 11.26.2014 Time: 9:30pm Location: 400 Hall Dayroom   Group Topic: Coping Skills  Goal Area(s) Addresses:  Patient will complete grateful mandala. Patient will identify benefit of identifying things he/she is grateful for.    Behavioral Response: Did not attend.   Marykay Lex Leann Mayweather, LRT/CTRS  Jearl Klinefelter 05/16/2013 12:50 PM

## 2013-05-16 NOTE — Progress Notes (Signed)
D: Pt denies SI/HI/AVH today. Pt presents depressed and worried. Pt paranoid and continues to feel like people are trying to kill him. Pt has minimal interaction on the milieu. Pt spends most of his day in his room. Pt continues to ambulate with walker. Pt c/o constant right knee pain 3/10. Pt is compliant with taking meds and attending groups today. A: Medications administered as ordered per MD. Verbal support given. Pt encouraged to attend groups. 15 minute checks performed for safety. R: Pt safety maintained.

## 2013-05-16 NOTE — Progress Notes (Signed)
Nutrition Brief Note  Patient identified on the Malnutrition Screening Tool (MST) Report.  Wt Readings from Last 10 Encounters:  05/15/13 131 lb (59.421 kg)  04/26/13 135 lb (61.236 kg)   Body mass index is 18.52 kg/(m^2). Patient meets criteria for underweight based on current BMI.   Discussed intake PTA with patient and compared to intake presently.  Discussed changes in intake, if any, and encouraged adequate intake of meals and snacks. Pt living on streets PTA, staying at a shelter, eating 2 meals/day. Pt reports he has lost 10 pounds in the past 2 weeks however weight trend shows pt's weight down only 4 pounds in the past month. Pt working on lunch. Reports eating well today. Interested in getting Ensure Complete during admission. Appears thin.   Current diet order is regular and pt is also offered choice of unit snacks mid-morning and mid-afternoon.  Pt is eating as desired.   Labs and medications reviewed.   Nutrition Dx:  Unintended wt change r/t suboptimal oral intake AEB pt report  Interventions:   Discussed the importance of nutrition and encouraged intake of food and beverages.     Supplements: Ensure Complete BID  No additional nutrition interventions warranted at this time. If nutrition issues arise, please consult RD.   Levon Hedger MS, RD, LDN [phone] Pager [phone] After Hours Pager

## 2013-05-16 NOTE — BHH Group Notes (Signed)
[facility] Group Therapy  05/16/2013 , 3:10 PM    Type of Therapy:  [facility] Presentation  Participation Level: Did not attend   Summary of Progress/Problems:  Onalee Hua from [facility] came to present his recovery story and play the guitar.    [name] 05/16/2013 , 3:10 PM

## 2013-05-16 NOTE — Progress Notes (Addendum)
D:  Patient in his room in bed on approach.  Patient states he had a good day today.  Patient states he had a better day today.  Patient states he is having thoughts in his mind.  Patient states he is having passive SI but verbally contracts for safety.  Patient pleasant and cooperative.  Patient denies HI and denies AVH.   A: Staff to monitor Q 15 mins for safety.  Encouragement and support offered.  Scheduled medications administered per orders. R: Patient remains safe on the unit.  Patient did not attend group tonight.  Patient taking administered medications.  Patient not visible on the unit tonight.
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
## 2013-05-17 DIAGNOSIS — F23 Brief psychotic disorder: Secondary | ICD-10-CM

## 2013-05-17 DIAGNOSIS — F323 Major depressive disorder, single episode, severe with psychotic features: Secondary | ICD-10-CM

## 2013-05-17 DIAGNOSIS — F1020 Alcohol dependence, uncomplicated: Secondary | ICD-10-CM

## 2013-05-17 DIAGNOSIS — F22 Delusional disorders: Secondary | ICD-10-CM

## 2013-05-17 MED ORDER — CITALOPRAM HYDROBROMIDE 20 MG PO TABS
20.0000 mg | ORAL_TABLET | Freq: Every day | ORAL | Status: DC
  Administered 2013-05-18 – 2013-05-24 (×7): 20 mg via ORAL
  Filled 2013-05-17: qty 1
  Filled 2013-05-17: qty 14
  Filled 2013-05-17: qty 1
  Filled 2013-05-17: qty 14
  Filled 2013-05-17 (×6): qty 1

## 2013-05-17 NOTE — Progress Notes (Signed)
D   Pt has been in bed resting with eyes closed since beginning of the shift   He does not appear to be in any distress A   Continue Q 15 checks  R   Pt safe at present
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
## 2013-05-17 NOTE — Progress Notes (Signed)
D: Pt presents depressed, sad, worried, minimal interaction on the milieu and forwards little information. Pt verbalized that he continues to feel paranoid and believes that people are after him to kill him. Pt compliant with taking meds and attending groups. A: Medications administered as ordered per MD. Verbal support given. Pt encouraged to attend groups.15 minute checks performed for safety. R: Pt safety maintained.

## 2013-05-18 MED ORDER — HALOPERIDOL 5 MG PO TABS
5.0000 mg | ORAL_TABLET | Freq: Every day | ORAL | Status: DC
  Administered 2013-05-18 – 2013-05-23 (×6): 5 mg via ORAL
  Filled 2013-05-18: qty 1
  Filled 2013-05-18: qty 14
  Filled 2013-05-18: qty 1
  Filled 2013-05-18: qty 14
  Filled 2013-05-18 (×6): qty 1

## 2013-05-18 NOTE — Progress Notes (Signed)
D. Pt has been up and visible in milieu this evening, attended and participated in evening wrap-up group. Pt does endorse depression and still fears that people are after him. Pt did complain of knee pain earlier but did say the medicine he received (naproxen) did help with the pain. Pt did receive medications without incident this evening. A. Support and encouragement provided, medication education done. R. Pt verbalized understanding, safety maintained, will continue to monitor.

## 2013-05-18 NOTE — Progress Notes (Signed)
D: Patient denies SI/HI and auditory and visual hallucinations. The patient continues to be paranoid and states that "people are trying to hurt me." When questioned about this belief, the patient becomes guarded and flat. The patient is attending groups and interacting appropriately within the milieu. The patient continues to use a front-wheel walker for ambulation.  A: Patient given emotional support from RN. Patient encouraged to come to staff with concerns and/or questions. Patient's medication routine continued. Patient's orders and plan of care reviewed.  R: Patient remains safe. Patient remains a fall risk. Will continue to monitor patient q15 minutes for safety.

## 2013-05-18 NOTE — Progress Notes (Signed)
Patient ID: Trevor Blackwell, male   DOB: May 28, 1971, 42 y.o.   MRN: [id number] [facility] MD Progress Note  05/18/2013 2:31 PM Trevor Blackwell  MRN:  [id number] Subjective: Patient states "I am still feeling depressed. I heard someone singing outside the window. I am worried it is the people who want to harm me from [place]."  Objective: Patient reports ongoing psychotic and depressive symptoms. He continues to fear that people are coming to harm him. Patient is compliant with his medications and is visible on the unit. Naftuli continues to appear depressed and withdrawn. He is observed with minimal interaction on the unit.   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 Post Traumatic Stress Disorder           Alcohol use disorder severe  ADL's:  Intact  Sleep: Fair  Appetite:  Fair  Suicidal Ideation: yes Plan:  denies Intent:  denies Means:  denies Homicidal Ideation:  Plan:  denies Intent:  denies Means:  denies AEB (as evidenced by):  Psychiatric Specialty Exam: Review of Systems  Constitutional: Negative.   HENT: Negative.   Eyes: Negative.   Respiratory: Negative.   Cardiovascular: Negative.   Gastrointestinal: Negative.   Genitourinary: Negative.   Musculoskeletal: Negative.   Skin: Negative.   Neurological: Negative.   Endo/Heme/Allergies: Negative.   Psychiatric/Behavioral: Positive for depression, suicidal ideas, hallucinations and substance abuse. The patient is nervous/anxious and has insomnia.     Blood pressure 125/79, pulse 121, temperature 98.1 F (36.7 C), temperature source  Oral, resp. rate 18, height 5' 10.5" (1.791 m), weight 59.421 kg (131 lb).Body mass index is 18.52 kg/(m^2).  General Appearance: Disheveled  Eye Solicitor::  Fair  Speech:  Clear and Coherent  Volume:  Decreased  Mood:  Anxious, Depressed and Hopeless  Affect:  Constricted  Thought Process:  Coherent  Orientation:  Full (Time, Place, and Person)  Thought Content:  Delusions, Hallucinations: Auditory Visual and Paranoid Ideation  Suicidal Thoughts:  Yes.  without intent/plan  Homicidal Thoughts:  No  Memory:  Immediate;   Fair Recent;   Fair Remote;   Fair  Judgement:  Poor  Insight:  Lacking  Psychomotor Activity:  Decreased  Concentration:  Fair  Recall:  Fair  Akathisia:  No  Handed:  Right  AIMS (if indicated):     Assets:  Desire for Improvement  Sleep:  Number of Hours: 6   Current Medications: Current Facility-Administered Medications  Medication Dose Route Frequency Provider Last Rate Last Dose  . acetaminophen (TYLENOL) tablet 650 mg  650 mg Oral Q6H PRN Benjaman Pott, MD   650 mg at 05/17/13 1156  . alum & mag hydroxide-simeth (MAALOX/MYLANTA) 200-200-20 MG/5ML suspension 30 mL  30 mL Oral Q4H PRN Benjaman Pott, MD      . citalopram (CELEXA) tablet 20 mg  20 mg Oral Daily Mojeed Akintayo   20 mg at 05/18/13 0737  . feeding supplement (ENSURE COMPLETE) (ENSURE COMPLETE) liquid 237 mL  237 mL Oral BID BM Lavena Bullion, RD   237 mL at 05/17/13 1310  . haloperidol (HALDOL) tablet 2 mg  2 mg Oral QHS Mojeed Akintayo   2 mg at 05/16/13 2220  . lisinopril (PRINIVIL,ZESTRIL)  tablet 10 mg  10 mg Oral Daily Mojeed Akintayo   10 mg at 05/18/13 0737  . magnesium hydroxide (MILK OF MAGNESIA) suspension 30 mL  30 mL Oral Daily PRN Benjaman Pott, MD      . naproxen (NAPROSYN) tablet 375 mg  375 mg Oral BID WC Mojeed Akintayo   375 mg at 05/18/13 0737  . traZODone (DESYREL) tablet 50 mg  50 mg Oral QHS PRN Fransisca Kaufmann, NP   50 mg at 05/16/13 2220    Lab Results: No results  found for this or any previous visit (from the past 48 hour(s)).  Physical Findings: AIMS: Facial and Oral Movements Muscles of Facial Expression: None, normal Lips and Perioral Area: None, normal Jaw: None, normal Tongue: None, normal,Extremity Movements Upper (arms, wrists, hands, fingers): None, normal Lower (legs, knees, ankles, toes): None, normal, Trunk Movements Neck, shoulders, hips: None, normal, Overall Severity Severity of abnormal movements (highest score from questions above): None, normal Incapacitation due to abnormal movements: None, normal Patient's awareness of abnormal movements (rate only patient's report): No Awareness, Dental Status Current problems with teeth and/or dentures?: No Does patient usually wear dentures?:  (pt reports artificial teeth on the bottom)  CIWA:  CIWA-Ar Total: 0 COWS:  COWS Total Score: 1  Treatment Plan Summary: Daily contact with patient to assess and evaluate symptoms and progress in treatment Medication management  Plan:1. Continue crisis management and stabilization. 2. Medication management to reduce current symptoms to base line and improve the patient's overall level of functioning 3. Treat health problems as indicated. 4. Develop treatment plan to decrease risk of relapse upon discharge and the need for readmission. 5. Psycho-social education regarding relapse prevention and self care. 6. Health care follow up as needed for medical problems. 7. Restart home medications where appropriate. 8. Continue Celexa to 20mg  po daily for depression/PTSD 9.Increase to Haldol 5 mg po Qhs for psychosis  Medical Decision Making Problem Points:  Established problem, stable/improving (1), Review of last therapy session (1) and Review of psycho-social stressors (1) Data Points:  Order Aims Assessment (2) Review of medication regiment & side effects (2) Review of new medications or change in dosage (2)  I certify that inpatient services  furnished can reasonably be expected to improve the patient's condition.   Fransisca Kaufmann, NP-C 05/18/2013, 2:31 PM Agree with assessment and plan Madie Reno A. Dub Mikes, M.D.

## 2013-05-18 NOTE — Progress Notes (Signed)
The focus of this group is to help patients review their daily goal of treatment and discuss progress on daily workbooks. Pt did not report to group.  

## 2013-05-19 MED ORDER — HALOPERIDOL 2 MG PO TABS
2.0000 mg | ORAL_TABLET | Freq: Every day | ORAL | Status: DC
  Administered 2013-05-19 – 2013-05-22 (×4): 2 mg via ORAL
  Filled 2013-05-19 (×6): qty 1

## 2013-05-19 NOTE — Progress Notes (Signed)
Patient ID: Trevor Blackwell, male   DOB: Dec 15, 1970, 42 y.o.   MRN: [id number] 05-19-13 nursing shift note: D: pt still complains of auditory hallucinations. He came to the medication window this am for medications, came to group and is not showing any adverse effects from his medications. He is a fall risk. A: fall risk precautions have been taken. He got an increase of haldol 2 mg po in the am daily per order by extender,  and today's dose was administered. He was already taking haldol at hs. R: he denied any si/hi. RN will continue to monitor this pt and Q 15 min ck's continue.

## 2013-05-19 NOTE — Progress Notes (Signed)
D: Patient lying in bed with eyes closed. Respirations even and non-labored. A: Staff will monitor on q 15 minute checks, follow treatment plan, and give meds as ordered. R: Appears asleep  

## 2013-05-19 NOTE — Progress Notes (Signed)
Patient ID: Trevor Blackwell, male   DOB: [birth year], 42 y.o.   MRN: [id number] Psychoeducational Group Note  Date:  05/19/2013 Time:0930am  Group Topic/Focus:  Identifying Needs:   The focus of this group is to help patients identify their personal needs that have been historically problematic and identify healthy behaviors to address their needs.  Participation Level:  Active  Participation Quality:  Appropriate  Affect:  Blunted  Cognitive:  Appropriate  Insight:  Supportive  Engagement in Group:  Supportive  Additional Comments: psychoeducational group-health coping skills   Valente David 05/19/2013,10:04 AM

## 2013-05-19 NOTE — Progress Notes (Signed)
Patient ID: Trevor Blackwell, male   DOB: [birth year], 42 y.o.   MRN: [id number] [facility] MD Progress Note  05/19/2013 10:06 AM Mika Anastasi  MRN:  [id number] Subjective: Patient is reporting worsening auditory hallucinations and is quietly anxious and agitated. He is ambulating in the hall way with a walker, and he is attending groups. He also is minimizing quite a bit, but is making every effort to get better. He is upset at another patient on the hall who is now in the quiet room due to her disruptive behavior.   Objective: Patient is minimizing, cooperative, quiet but informative. AVH continue, SI is decreasing, no HI.  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 Post Traumatic Stress Disorder           Alcohol use disorder severe  ADL's:  Intact  Sleep: Fair  Appetite:  Fair  Suicidal Ideation: yes Plan:  denies Intent:  denies Means:  denies Homicidal Ideation:  Plan:  denies Intent:  denies Means:  denies AEB (as evidenced by):  Psychiatric Specialty Exam: Review of Systems  Constitutional: Negative.   HENT: Negative.   Eyes: Negative.   Respiratory: Negative.   Cardiovascular: Negative.   Gastrointestinal: Negative.   Genitourinary: Negative.   Musculoskeletal: Negative.   Skin: Negative.   Neurological: Negative.   Endo/Heme/Allergies: Negative.   Psychiatric/Behavioral: Positive for depression, suicidal ideas, hallucinations and substance abuse. The patient is nervous/anxious and has insomnia.     Blood pressure 131/90, pulse 97, temperature 98.7 F (37.1 C), temperature  source Oral, resp. rate 17, height 5' 10.5" (1.791 m), weight 59.421 kg (131 lb).Body mass index is 18.52 kg/(m^2).  General Appearance: Disheveled  Eye Solicitor::  Fair  Speech:  Clear and Coherent  Volume:  Decreased  Mood:  Anxious, Depressed and Hopeless  Affect:  Constricted  Thought Process:  Coherent  Orientation:  Full (Time, Place, and Person)  Thought Content:  Delusions, Hallucinations: Auditory Visual and Paranoid Ideation  Suicidal Thoughts:  Yes.  without intent/plan  Homicidal Thoughts:  No  Memory:  Immediate;   Fair Recent;   Fair Remote;   Fair  Judgement:  Poor  Insight:  Lacking  Psychomotor Activity:  Decreased  Concentration:  Fair  Recall:  Fair  Akathisia:  No  Handed:  Right  AIMS (if indicated):     Assets:  Desire for Improvement  Sleep:  Number of Hours: 6   Current Medications: Current Facility-Administered Medications  Medication Dose Route Frequency Provider Last Rate Last Dose  . acetaminophen (TYLENOL) tablet 650 mg  650 mg Oral Q6H PRN Benjaman Pott, MD   650 mg at 05/17/13 1156  . alum & mag hydroxide-simeth (MAALOX/MYLANTA) 200-200-20 MG/5ML suspension 30 mL  30 mL Oral Q4H PRN Benjaman Pott, MD      . citalopram (CELEXA) tablet 20 mg  20 mg Oral Daily Mojeed Akintayo   20 mg at 05/19/13 0823  . feeding supplement (ENSURE COMPLETE) (ENSURE COMPLETE) liquid 237 mL  237 mL Oral BID BM Lavena Bullion, RD   237 mL at 05/18/13 1958  . haloperidol (HALDOL) tablet 5 mg  5 mg Oral QHS Fransisca Kaufmann, NP   5 mg at 05/18/13 2130  .  lisinopril (PRINIVIL,ZESTRIL) tablet 10 mg  10 mg Oral Daily Mojeed Akintayo   10 mg at 05/19/13 0823  . magnesium hydroxide (MILK OF MAGNESIA) suspension 30 mL  30 mL Oral Daily PRN Benjaman Pott, MD      . naproxen (NAPROSYN) tablet 375 mg  375 mg Oral BID WC Mojeed Akintayo   375 mg at 05/19/13 0823  . traZODone (DESYREL) tablet 50 mg  50 mg Oral QHS PRN Fransisca Kaufmann, NP   50 mg at 05/16/13 2220    Lab Results: No  results found for this or any previous visit (from the past 48 hour(s)).  Physical Findings: AIMS: Facial and Oral Movements Muscles of Facial Expression: None, normal Lips and Perioral Area: None, normal Jaw: None, normal Tongue: None, normal,Extremity Movements Upper (arms, wrists, hands, fingers): None, normal Lower (legs, knees, ankles, toes): None, normal, Trunk Movements Neck, shoulders, hips: None, normal, Overall Severity Severity of abnormal movements (highest score from questions above): None, normal Incapacitation due to abnormal movements: None, normal Patient's awareness of abnormal movements (rate only patient's report): No Awareness, Dental Status Current problems with teeth and/or dentures?: No Does patient usually wear dentures?:  (pt reports artificial teeth on the bottom)  CIWA:  CIWA-Ar Total: 0 COWS:  COWS Total Score: 1  Treatment Plan Summary: Daily contact with patient to assess and evaluate symptoms and progress in treatment Medication management  Plan:1. Continue crisis management and stabilization. 2. Medication management to reduce current symptoms to base line and improve the patient's overall level of functioning 3. Treat health problems as indicated. 4. Develop treatment plan to decrease risk of relapse upon discharge and the need for readmission. 5. Psycho-social education regarding relapse prevention and self care. 6. Health care follow up as needed for medical problems. 7. Restart home medications where appropriate. 8. Continue Celexa to 20mg  po daily for depression/PTSD 9.Increase to Haldol 5 mg po Qhs for psychosis 10 . Will give 2mg  po each morning of Haldol and give 5mg  at hs. Medical Decision Making Problem Points:  Established problem, stable/improving (1), Review of last therapy session (1) and Review of psycho-social stressors (1) Data Points:  Order Aims Assessment (2) Review of medication regiment & side effects (2) Review of new  medications or change in dosage (2)  I certify that inpatient services furnished can reasonably be expected to improve the patient's condition.    Rona Ravens. [facility]  Attending Addendum:I have discussed this patient with the above provider. I have reviewed the history, physical exam, assessment and plan and agree with the above.  Jacqulyn Cane, M.D.  05/20/2013 11:48 PM

## 2013-05-19 NOTE — Progress Notes (Signed)
[facility] Group Notes:  (Nursing/MHT/Case Management/Adjunct)  Date:  05/19/2013  Time:  8:41 PM  Type of Therapy:  Group Therapy  Participation Level:  Active  Participation Quality:  Appropriate  Affect:  Appropriate  Cognitive:  Appropriate  Insight:  Appropriate  Engagement in Group:  Engaged  Modes of Intervention:  Discussion  Summary of Progress/Problems:The patient expressed that his day was ok The patient said that all of his needs were meet by staff.  Octavio Manns 05/19/2013, 8:41 PM

## 2013-05-19 NOTE — [facility] Group Notes (Signed)
[facility] Group Notes:  (Clinical Social Work)  05/19/2013  11:15-11:45AM  Summary of Progress/Problems:   The main focus of today's process group was for the patient to identify ways in which they have in the past sabotaged their own recovery and reasons they may have done this/what they received from doing it.  We then worked to identify a specific plan to avoid doing this when discharged from the hospital for this admission.  The patient expressed that he always takes his medications and he has friends who helped him to get to the hospital, and who help him to get his medications.  He stated he came here as a refugee from [place] in 2001.  Type of Therapy:  Group Therapy - Process  Participation Level:  Minimal  Participation Quality:  Attentive  Affect:  Anxious and Blunted  Cognitive:  Oriented  Insight:  Improving  Engagement in Therapy:  Improving  Modes of Intervention:  Clarification, Education, Exploration, Discussion  Ambrose Mantle, LCSW 05/19/2013, 12:44 PM

## 2013-05-20 NOTE — Progress Notes (Signed)
Pt visible in the dayroom tonight though quiet and keeps to self. Complained of a headache which was relieved with tylenol. When asked about hallucinations pt states, "frequently but none tonight." Pt given support, medicated per orders. Fall precautions reviewed and in place. Pt remains safe at this time. No SI/HI voiced. Lawrence Marseilles

## 2013-05-20 NOTE — Progress Notes (Signed)
Patient ID: Earnstine Regal, male   DOB: January 03, 1971, 42 y.o.   MRN: [id number] Psychoeducational Group Note  Date:  05/20/2013 Time:  0930am  Group Topic/Focus:  Making Healthy Choices:   The focus of this group is to help patients identify negative/unhealthy choices they were using prior to admission and identify positive/healthier coping strategies to replace them upon discharge.  Participation Level:  Active  Participation Quality:  Appropriate  Affect:  Blunted  Cognitive:  Appropriate  Insight:  Limited  Engagement in Group:  Limited  Additional Comments:  Inventory and Psychoeducational group   Valente David 05/20/2013,11:20 AM

## 2013-05-20 NOTE — [facility] Group Notes (Signed)
[facility] Group Notes:  (Clinical Social Work)  05/20/2013   11:15am-12:00pm  Summary of Progress/Problems:  The main focus of today's process group was to listen to a variety of genres of music and to identify that different types of music provoke different responses.  The patient then was able to identify personally what was soothing for them, as well as energizing.  Handouts were used to record feelings evoked, as well as how patient can personally use this knowledge in sleep habits, with depression, and with other symptoms.  The patient expressed simple words for his feelings on each song, such as "good" and "sad".  He did not engage in group further than that.  Type of Therapy:  Music Therapy   Participation Level:  Active  Participation Quality:  Attentive and Sharing  Affect:  Blunted  Cognitive:  Oriented  Insight:  Engaged  Engagement in Therapy:  Engaged  Modes of Intervention:   Activity, Exploration  Ambrose Mantle, LCSW 05/20/2013, 12:30pm

## 2013-05-20 NOTE — Progress Notes (Signed)
Spoke with pt 1:1. He remains quiet and forwards little information. Denies AVH today and is pleased with that improvement. Does not exhibit any paranoid behavior though again is difficult to assess given his quiet demeanor. Supported, reassured. Fall precautions reviewed and acknowledged. Will medicate per orders. No SI/HI and pt is safe. Lawrence Marseilles

## 2013-05-20 NOTE — Progress Notes (Signed)
Patient ID: Trevor Blackwell, male   DOB: 1971/05/16, 42 y.o.   MRN: [id number] 05-20-13 @ 1330 Nursing shift note: D: pt has come to the medication window, gone to groups and been visible in the milieu. He remains polite/cooperative  A: staff continues to probe, support and encourage this patient. He stated that the auditory hallucinations has decreased. He is eating well and getting nutritional supplements, ensure (vanilla). R: on his inventory sheet he denies any si/hi. He stated he sleep well, appetite good, attention improving with his hopelessness at 5. He reports chilling and mild pain but hasn't required anything for the pain. RN will monitor and Q 15 min ck's continue.

## 2013-05-20 NOTE — Progress Notes (Signed)
Adult Psychoeducational Group Note  Date:  05/20/2013 Time:  9:51 PM  Group Topic/Focus:  Wrap-Up Group:   The focus of this group is to help patients review their daily goal of treatment and discuss progress on daily workbooks.  Participation Level:  Minimal  Participation Quality:  Inattentive  Affect:  Flat and Not Congruent  Cognitive:  Lacking  Insight: Limited  Engagement in Group:  Limited  Modes of Intervention:  Discussion  Additional Comments:  Pt did not say a whole lot in group other than his friend came to visit, pt stated that he currently do not have a support system.    Louanne Belton 05/20/2013, 9:51 PM

## 2013-05-21 NOTE — [facility] Group Notes (Signed)
[facility] LCSW Aftercare Discharge Planning Group Note   05/21/2013 8:19 AM  Participation Quality:  Engaged  Mood/Affect:  Flat  Depression Rating:    Anxiety Rating:    Thoughts of Suicide:  No Will you contract for safety?   NA  Current AVH:  No  Plan for Discharge/Comments:  With no accompanying affect, Trevor Blackwell showed me a letter from a previous church sponsor now living in [place] stating that they would like him to come stay with them.  Told him we would call today to try to figure out details.  He denies all mental health symptoms; no SI, no depression, no psychosis.  Transportation Means: unk  Supports:  Family in [place]  [place], [place]

## 2013-05-21 NOTE — Tx Team (Signed)
  Interdisciplinary Treatment Plan Update   Date Reviewed:  05/21/2013  Time Reviewed:  8:19 AM  Progress in Treatment:   Attending groups: Yes Participating in groups: Yes Taking medication as prescribed: Yes  Tolerating medication: Yes Family/Significant other contact made: Yes  Patient understands diagnosis: Yes  Discussing patient identified problems/goals with staff: Yes Medical problems stabilized or resolved: Yes Denies suicidal/homicidal ideation: Yes Patient has not harmed self or others: Yes  For review of initial/current patient goals, please see plan of care.  Estimated Length of Stay:  1-3 days  Reason for Continuation of Hospitalization: Medication stabilization  New Problems/Goals identified:  N/A  Discharge Plan or Barriers:   Return to get his belongings, and then travel to [place] to stay with a friend  Additional Comments:  Trevor Blackwell states that his main concern re: d/c is that "my roommates will still be making noises that cause problems with my mind."  He denies hearing the noises now.  He continues to appear depressed and has minimal interaction with his peers. Patient is reporting decreasing symptoms of psychosis and depression.     Attendees:  Signature: Thedore Mins, MD 05/21/2013 8:19 AM   Signature: Richelle Ito, LCSW 05/21/2013 8:19 AM  Signature: Fransisca Kaufmann, NP 05/21/2013 8:19 AM  Signature: Joslyn Devon, RN 05/21/2013 8:19 AM  Signature: Liborio Nixon, RN 05/21/2013 8:19 AM  Signature:  05/21/2013 8:19 AM  Signature:   05/21/2013 8:19 AM  Signature:    Signature:    Signature:    Signature:    Signature:    Signature:      Scribe for Treatment Team:   Richelle Ito, LCSW  05/21/2013 8:19 AM

## 2013-05-21 NOTE — Progress Notes (Signed)
Seen and agreed. Benson Porcaro, MD 

## 2013-05-21 NOTE — Progress Notes (Signed)
D: Pt presents depressed this morning. Pt denies SI/HI/AVH. Pt has minimal interaction with others on the unit. Pt forwards little information. Pt c/o right knee pain and received scheduled pain med. Pt continues to ambulate using walker. Pt compliant with taking meds and attending groups. A: Medications administered as ordered per MD. Verbal support given. Pt encouraged to attend groups. 15 minute checks performed for safety. R: Pt safety maintained.

## 2013-05-21 NOTE — [facility] Group Notes (Signed)
[facility] LCSW Group Therapy  05/21/2013 1:15 pm  Type of Therapy: Process Group Therapy  Participation Level:  Active  Participation Quality:  Appropriate  Affect:  Flat  Cognitive:  Oriented  Insight:  Improving  Engagement in Group:  Limited  Engagement in Therapy:  Limited  Modes of Intervention:  Activity, Clarification, Education, Problem-solving and Support  Summary of Progress/Problems: Today's group addressed the issue of overcoming obstacles.  Patients were asked to identify their biggest obstacle post d/c that stands in the way of their on-going success, and then problem solve as to how to manage this.  Trevor Blackwell states his biggest barrier is "if my roomates make noises again that cause problems for my mind."  This was his primary complaint when he was admitted.  Denies hearing noises here, but obviously still concerned about this.  We talked about his plan to go to [place], and he agreed that the sooner he gets there, the less chance he has of encountering more problems here.  Ida Rogue 05/21/2013   3:39 PM

## 2013-05-21 NOTE — Progress Notes (Signed)
Patient ID: Trevor Blackwell, male   DOB: 1971/04/29, 42 y.o.   MRN: [id number] [facility] MD Progress Note  05/21/2013 10:27 AM Trevor Blackwell  MRN:  [id number] Subjective:  Patient states "I am not hearing the noises anymore. Have not heard them in a day. I think my mind is better."  Objective:  Patient assessed in his room today. He continues to appear depressed and has minimal interaction with his peers. Patient is reporting decreasing symptoms of psychosis and depression. The patient is extremely soft spoken so it is difficult to understand his responses to questions that are asked. He also forwards very little information during conversation.   Diagnosis:   DSM5: Schizophrenia Disorders:  Delusional Disorder (297.1) Obsessive-Compulsive Disorders:   Trauma-Stressor Disorders:  Posttraumatic Stress Disorder (309.81) Substance/Addictive Disorders:  Alcohol Intoxication with Use Disorder - Severe (F10.229) Depressive Disorders:  Major Depressive Disorder - with Psychotic Features (296.24)  Axis I: Major Depression, single episode with psychosis. Post Traumatic Stress Disorder           Alcohol use disorder severe  ADL's:  Intact  Sleep: Fair  Appetite:  Fair  Suicidal Ideation: yes Plan:  denies Intent:  denies Means:  denies Homicidal Ideation:  Plan:  denies Intent:  denies Means:  denies AEB (as evidenced by):  Psychiatric Specialty Exam: Review of Systems  Constitutional: Negative.   HENT: Negative.   Eyes: Negative.   Respiratory: Negative.   Cardiovascular: Negative.   Gastrointestinal: Negative.   Genitourinary: Negative.   Musculoskeletal: Negative.   Skin: Negative.   Neurological: Negative.   Endo/Heme/Allergies: Negative.   Psychiatric/Behavioral: Positive for depression and hallucinations. Negative for suicidal ideas, memory loss and substance abuse. The patient has insomnia. The patient is not nervous/anxious.     Blood pressure 123/79, pulse 82, temperature 98.8  F (37.1 C), temperature source Oral, resp. rate 20, height 5' 10.5" (1.791 m), weight 59.421 kg (131 lb).Body mass index is 18.52 kg/(m^2).  General Appearance: Disheveled  Eye Solicitor::  Fair  Speech:  Clear and Coherent  Volume:  Decreased  Mood:  Anxious, Depressed and Hopeless  Affect:  Constricted  Thought Process:  Coherent  Orientation:  Full (Time, Place, and Person)  Thought Content:  Delusions, Hallucinations: Auditory Visual and Paranoid Ideation  Suicidal Thoughts:  Yes.  without intent/plan  Homicidal Thoughts:  No  Memory:  Immediate;   Fair Recent;   Fair Remote;   Fair  Judgement:  Poor  Insight:  Lacking  Psychomotor Activity:  Decreased  Concentration:  Fair  Recall:  Fair  Akathisia:  No  Handed:  Right  AIMS (if indicated):     Assets:  Desire for Improvement  Sleep:  Number of Hours: 6.75   Current Medications: Current Facility-Administered Medications  Medication Dose Route Frequency Provider Last Rate Last Dose  . acetaminophen (TYLENOL) tablet 650 mg  650 mg Oral Q6H PRN Benjaman Pott, MD   650 mg at 05/19/13 2043  . alum & mag hydroxide-simeth (MAALOX/MYLANTA) 200-200-20 MG/5ML suspension 30 mL  30 mL Oral Q4H PRN Benjaman Pott, MD      . citalopram (CELEXA) tablet 20 mg  20 mg Oral Daily Mojeed Akintayo   20 mg at 05/21/13 0805  . feeding supplement (ENSURE COMPLETE) (ENSURE COMPLETE) liquid 237 mL  237 mL Oral BID BM Lavena Bullion, RD   237 mL at 05/20/13 1948  . haloperidol (HALDOL) tablet 2 mg  2 mg Oral Daily Verne Spurr, PA-C   2 mg  at 05/21/13 0805  . haloperidol (HALDOL) tablet 5 mg  5 mg Oral QHS Fransisca Kaufmann, NP   5 mg at 05/20/13 2133  . lisinopril (PRINIVIL,ZESTRIL) tablet 10 mg  10 mg Oral Daily Mojeed Akintayo   10 mg at 05/21/13 0805  . magnesium hydroxide (MILK OF MAGNESIA) suspension 30 mL  30 mL Oral Daily PRN Benjaman Pott, MD      . naproxen (NAPROSYN) tablet 375 mg  375 mg Oral BID WC Mojeed Akintayo   375 mg at 05/21/13  0805  . traZODone (DESYREL) tablet 50 mg  50 mg Oral QHS PRN Fransisca Kaufmann, NP   50 mg at 05/16/13 2220    Lab Results: No results found for this or any previous visit (from the past 48 hour(s)).  Physical Findings: AIMS: Facial and Oral Movements Muscles of Facial Expression: None, normal Lips and Perioral Area: None, normal Jaw: None, normal Tongue: None, normal,Extremity Movements Upper (arms, wrists, hands, fingers): None, normal Lower (legs, knees, ankles, toes): None, normal, Trunk Movements Neck, shoulders, hips: None, normal, Overall Severity Severity of abnormal movements (highest score from questions above): None, normal Incapacitation due to abnormal movements: None, normal Patient's awareness of abnormal movements (rate only patient's report): No Awareness, Dental Status Current problems with teeth and/or dentures?: No Does patient usually wear dentures?:  (pt reports artificial teeth on the bottom)  CIWA:  CIWA-Ar Total: 0 COWS:  COWS Total Score: 1  Treatment Plan Summary: Daily contact with patient to assess and evaluate symptoms and progress in treatment Medication management  Plan:1. Continue crisis management and stabilization. 2. Medication management to reduce current symptoms to base line and improve the patient's overall level of functioning 3. Treat health problems as indicated. 4. Develop treatment plan to decrease risk of relapse upon discharge and the need for readmission. 5. Psycho-social education regarding relapse prevention and self care. 6. Health care follow up as needed for medical problems. 7. Restart home medications where appropriate. 8. Continue Celexa to 20mg  po daily for depression/PTSD 9. Continue Haldol 2 mg every am and 5 mg at hs for psychosis.  10. Case manager reports that he will call the patient's sponsor in [place] today to work on arranging transportation and finalizing d/c plan.   Medical Decision Making Problem Points:   Established problem, stable/improving (1), Review of last therapy session (1) and Review of psycho-social stressors (1) Data Points:  Order Aims Assessment (2) Review of medication regiment & side effects (2) Review of new medications or change in dosage (2)  I certify that inpatient services furnished can reasonably be expected to improve the patient's condition.    Fransisca Kaufmann NP-C 05/21/2013 10:27 AM

## 2013-05-22 NOTE — [facility] Group Notes (Signed)
[facility] LCSW Group Therapy  05/22/2013 , 12:45 PM   Type of Therapy:  Group Therapy  Participation Level:  Active  Participation Quality:  Attentive  Affect:  Appropriate  Cognitive:  Alert  Insight: Limited  Engagement in Therapy:  Limited  Modes of Intervention:  Discussion, Exploration and Socialization  Summary of Progress/Problems: Today's group focused on the term Diagnosis.  Participants were asked to define the term, and then pronounce whether it is a negative, positive or neutral term.  Trevor Blackwell sat quietly throughout group, and offered nothing spontaneously.  When asked about diagnosis, he simply stated he had no thoughts on the matter.    [name] 05/22/2013 , 12:45 PM

## 2013-05-22 NOTE — Progress Notes (Signed)
D   Pt was in bed resting when this writer approached him   He is pleasant on approach and makes good eye contact   He answers questions appropriately and is aware of his safety needs  He attends groups and denies suicidal and homicidal ideation A   Verbal support given   Medications administered and effectiveness monitored  Discussed fall risk issues and his confidence to use his walker effectively    Q 15 min checks R   Pt safe at present

## 2013-05-22 NOTE — [facility] Suicide Risk Assessment (Signed)
[facility] INPATIENT:  Family/Significant Other Suicide Prevention Education  Suicide Prevention Education:  Education Completed; Maura Crandall, friend, [[phone]  has been identified by the patient as the family member/significant other with whom the patient will be residing, and identified as the person(s) who will aid the patient in the event of a mental health crisis (suicidal ideations/suicide attempt).  With written consent from the patient, the family member/significant other has been provided the following suicide prevention education, prior to the and/or following the discharge of the patient.  The suicide prevention education provided includes the following:  Suicide risk factors  Suicide prevention and interventions  National Suicide Hotline telephone number  [facility] assessment telephone number  [facility] Emergency Assistance 911  [facility] and/or Residential Mobile Crisis Unit telephone number  Request made of family/significant other to:  Remove weapons (e.g., guns, rifles, knives), all items previously/currently identified as safety concern.    Remove drugs/medications (over-the-counter, prescriptions, illicit drugs), all items previously/currently identified as a safety concern.  The family member/significant other verbalizes understanding of the suicide prevention education information provided.  The family member/significant other agrees to remove the items of safety concern listed above.  [name] 05/22/2013, 5:08 PM

## 2013-05-22 NOTE — Progress Notes (Signed)
Patient ID: Trevor Blackwell, male   DOB: [birth year], 42 y.o.   MRN: [id number] [facility] MD Progress Note  05/22/2013 11:06 AM Trevor Blackwell  MRN:  [id number] Subjective:  Patient states "I am doing much better since I got on those medications."  Objective:  Patient reports decreased delusions, psychosis, anxiety and depressive symptoms. He states that his medications have been working well for him. He denies cravings for alcohol. He is compliant with his medications and has not endorsed any adverse reactions. Patient is currently homeless but says his sponsor in [place] wants him to moved back with them and he is hoping to be able to move back upon discharge. Diagnosis:   DSM5: Schizophrenia Disorders:  Delusional Disorder (297.1) Obsessive-Compulsive Disorders:   Trauma-Stressor Disorders:  Posttraumatic Stress Disorder (309.81) Substance/Addictive Disorders:  Alcohol Intoxication with Use Disorder - Severe (F10.229) Depressive Disorders:  Major Depressive Disorder - with Psychotic Features (296.24)  Axis I: Major Depression, single episode with psychosis. Post Traumatic Stress Disorder           Alcohol use disorder severe  ADL's:  Intact  Sleep: Fair  Appetite:  Fair  Suicidal Ideation: yes Plan:  denies Intent:  denies Means:  denies Homicidal Ideation:  Plan:  denies Intent:  denies Means:  denies AEB (as evidenced by):  Psychiatric Specialty Exam: Review of Systems  Constitutional: Negative.   HENT: Negative.   Eyes: Negative.   Respiratory: Negative.   Cardiovascular: Negative.   Gastrointestinal: Negative.   Genitourinary: Negative.   Musculoskeletal: Negative.   Skin: Negative.   Neurological: Negative.   Endo/Heme/Allergies: Negative.   Psychiatric/Behavioral: Positive for depression. Negative for suicidal ideas, memory loss and substance abuse. The patient is not nervous/anxious.     Blood pressure 128/90, pulse 74, temperature 98 F (36.7 C), temperature source  Oral, resp. rate 18, height 5' 10.5" (1.791 m), weight 59.421 kg (131 lb).Body mass index is 18.52 kg/(m^2).  General Appearance: fairly groomed  Patent attorney::  Fair  Speech:  Clear and Coherent  Volume:  Decreased  Mood:  Anxious  Affect:  Constricted  Thought Process:  Coherent  Orientation:  Full (Time, Place, and Person)  Thought Content:  Delusions, Hallucinations: Auditory  Suicidal Thoughts:  denies  Homicidal Thoughts:  No  Memory:  Immediate;   Fair Recent;   Fair Remote;   Fair  Judgement:  marginal  Insight:  marginal  Psychomotor Activity:  normal  Concentration:  Fair  Recall:  Fair  Akathisia:  No  Handed:  Right  AIMS (if indicated):     Assets:  Desire for Improvement  Sleep:  Number of Hours: 5.75   Current Medications: Current Facility-Administered Medications  Medication Dose Route Frequency Provider Last Rate Last Dose  . acetaminophen (TYLENOL) tablet 650 mg  650 mg Oral Q6H PRN Benjaman Pott, MD   650 mg at 05/19/13 2043  . alum & mag hydroxide-simeth (MAALOX/MYLANTA) 200-200-20 MG/5ML suspension 30 mL  30 mL Oral Q4H PRN Benjaman Pott, MD      . citalopram (CELEXA) tablet 20 mg  20 mg Oral Daily Maelani Yarbro   20 mg at 05/22/13 0750  . feeding supplement (ENSURE COMPLETE) (ENSURE COMPLETE) liquid 237 mL  237 mL Oral BID BM Lavena Bullion, RD   237 mL at 05/21/13 2137  . haloperidol (HALDOL) tablet 5 mg  5 mg Oral QHS Fransisca Kaufmann, NP   5 mg at 05/21/13 2137  . lisinopril (PRINIVIL,ZESTRIL) tablet 10 mg  10 mg  Oral Daily Letecia Arps   10 mg at 05/22/13 0750  . magnesium hydroxide (MILK OF MAGNESIA) suspension 30 mL  30 mL Oral Daily PRN Benjaman Pott, MD      . naproxen (NAPROSYN) tablet 375 mg  375 mg Oral BID WC Moussa Wiegand   375 mg at 05/22/13 0750  . traZODone (DESYREL) tablet 50 mg  50 mg Oral QHS PRN Fransisca Kaufmann, NP   50 mg at 05/16/13 2220    Lab Results: No results found for this or any previous visit (from the past 48  hour(s)).  Physical Findings: AIMS: Facial and Oral Movements Muscles of Facial Expression: None, normal Lips and Perioral Area: None, normal Jaw: None, normal Tongue: None, normal,Extremity Movements Upper (arms, wrists, hands, fingers): None, normal Lower (legs, knees, ankles, toes): None, normal, Trunk Movements Neck, shoulders, hips: None, normal, Overall Severity Severity of abnormal movements (highest score from questions above): None, normal Incapacitation due to abnormal movements: None, normal Patient's awareness of abnormal movements (rate only patient's report): No Awareness, Dental Status Current problems with teeth and/or dentures?: No Does patient usually wear dentures?:  (pt reports artificial teeth on the bottom)  CIWA:  CIWA-Ar Total: 0 COWS:  COWS Total Score: 1  Treatment Plan Summary: Daily contact with patient to assess and evaluate symptoms and progress in treatment Medication management  Plan:1. Continue crisis management and stabilization. 2. Medication management to reduce current symptoms to base line and improve the patient's overall level of functioning 3. Treat health problems as indicated. 4. Develop treatment plan to decrease risk of relapse upon discharge and the need for readmission. 5. Psycho-social education regarding relapse prevention and self care. 6. Health care follow up as needed for medical problems. 7. Restart home medications where appropriate. 8. Continue Celexa to 20mg  po daily for depression/PTSD 9. Continue Haldol 2 mg every am and 5 mg at hs for psychosis.  10. Case manager reports that he will call the patient's sponsor in [place] today to work on arranging transportation and finalizing d/c plan.   Medical Decision Making Problem Points:  Established problem, stable/improving (1), Review of last therapy session (1) and Review of psycho-social stressors (1) Data Points:  Order Aims Assessment (2) Review of medication regiment &  side effects (2) Review of new medications or change in dosage (2)  I certify that inpatient services furnished can reasonably be expected to improve the patient's condition.    Thedore Mins, MD 05/22/2013 11:06 AM

## 2013-05-22 NOTE — Progress Notes (Signed)
D   Pt is pleasant and cooperative   He reported the doctor plans to discharge him Thursday and he said he was happy about that  He appears a little sad and anxious but he is denying suicidal ideation and denies voices    He is compliant with all treatment A   Verbal support given   Medications administered and effectiveness monitored   Q 15 min checks R   Pt safe at present

## 2013-05-22 NOTE — Progress Notes (Signed)
D: Pt denies SI/HI/AVH. Pt presents sad but denies feeling depressed. Pt continues to ambulate with a walker d/t intermittent pain to his right knee. Pt compliant with taking meds and attending groups. No complaints verbalized by pt at this time. A: Medications administered as ordered per MD. Verbal support given. Pt encouraged to attend groups. 15 minute checks performed for safely. R: Pt safety maintained.

## 2013-05-23 NOTE — Progress Notes (Signed)
Patient ID: Trevor Blackwell, male   DOB: July 30, 1970, 42 y.o.   MRN: [id number] [facility] MD Progress Note  05/23/2013 10:36 AM Trevor Blackwell  MRN:  [id number] Subjective:  "I am doing much better on my  medications."  Objective:  Patient continues to verbalize that he is making positive progress, he denies suicidal/homicidal ideation, intent or plan. He also reports decreased delusions, psychosis, anxiety and depressive symptoms. He denies cravings for alcohol. He is compliant with his medications and has not endorsed any adverse reactions.  Diagnosis:   DSM5: Schizophrenia Disorders:  Delusional Disorder (297.1) Obsessive-Compulsive Disorders:   Trauma-Stressor Disorders:  Posttraumatic Stress Disorder (309.81) Substance/Addictive Disorders:  Alcohol Intoxication with Use Disorder - Severe (F10.229) Depressive Disorders:  Major Depressive Disorder - with Psychotic Features (296.24)  Axis I: Major Depression, single episode with psychosis. Post Traumatic Stress Disorder           Alcohol use disorder severe  ADL's:  Intact  Sleep: Fair  Appetite:  Fair  Suicidal Ideation: yes Plan:  denies Intent:  denies Means:  denies Homicidal Ideation:  Plan:  denies Intent:  denies Means:  denies AEB (as evidenced by):  Psychiatric Specialty Exam: Review of Systems  Constitutional: Negative.   HENT: Negative.   Eyes: Negative.   Respiratory: Negative.   Cardiovascular: Negative.   Gastrointestinal: Negative.   Genitourinary: Negative.   Musculoskeletal: Negative.   Skin: Negative.   Neurological: Negative.   Endo/Heme/Allergies: Negative.   Psychiatric/Behavioral: Positive for depression. Negative for suicidal ideas, memory loss and substance abuse. The patient is not nervous/anxious.     Blood pressure 130/89, pulse 75, temperature 97.8 F (36.6 C), temperature source Oral, resp. rate 20, height 5' 10.5" (1.791 m), weight 59.421 kg (131 lb).Body mass index is 18.52 kg/(m^2).  General  Appearance: fairly groomed  Patent attorney::  Fair  Speech:  Clear and Coherent  Volume:  normal  Mood:  Anxious  Affect:  Constricted  Thought Process:  Coherent  Orientation:  Full (Time, Place, and Person)  Suicidal Thoughts:  denies  Homicidal Thoughts:  No  Memory:  Immediate;   Fair Recent;   Fair Remote;   Fair  Judgement:  marginal  Insight:  marginal  Psychomotor Activity:  normal  Concentration:  Fair  Recall:  Fair  Akathisia:  No  Handed:  Right  AIMS (if indicated):     Assets:  Desire for Improvement  Sleep:  Number of Hours: 6.75   Current Medications: Current Facility-Administered Medications  Medication Dose Route Frequency Provider Last Rate Last Dose  . acetaminophen (TYLENOL) tablet 650 mg  650 mg Oral Q6H PRN Benjaman Pott, MD   650 mg at 05/19/13 2043  . alum & mag hydroxide-simeth (MAALOX/MYLANTA) 200-200-20 MG/5ML suspension 30 mL  30 mL Oral Q4H PRN Benjaman Pott, MD      . citalopram (CELEXA) tablet 20 mg  20 mg Oral Daily Taray Normoyle   20 mg at 05/23/13 0809  . feeding supplement (ENSURE COMPLETE) (ENSURE COMPLETE) liquid 237 mL  237 mL Oral BID BM Lavena Bullion, RD   237 mL at 05/22/13 2158  . haloperidol (HALDOL) tablet 5 mg  5 mg Oral QHS Fransisca Kaufmann, NP   5 mg at 05/22/13 2158  . lisinopril (PRINIVIL,ZESTRIL) tablet 10 mg  10 mg Oral Daily Cameka Rae   10 mg at 05/23/13 0809  . magnesium hydroxide (MILK OF MAGNESIA) suspension 30 mL  30 mL Oral Daily PRN Benjaman Pott, MD      .  naproxen (NAPROSYN) tablet 375 mg  375 mg Oral BID WC Lorri Fukuhara   375 mg at 05/23/13 0809  . traZODone (DESYREL) tablet 50 mg  50 mg Oral QHS PRN Fransisca Kaufmann, NP   50 mg at 05/16/13 2220    Lab Results: No results found for this or any previous visit (from the past 48 hour(s)).  Physical Findings: AIMS: Facial and Oral Movements Muscles of Facial Expression: None, normal Lips and Perioral Area: None, normal Jaw: None, normal Tongue: None,  normal,Extremity Movements Upper (arms, wrists, hands, fingers): None, normal Lower (legs, knees, ankles, toes): None, normal, Trunk Movements Neck, shoulders, hips: None, normal, Overall Severity Severity of abnormal movements (highest score from questions above): None, normal Incapacitation due to abnormal movements: None, normal Patient's awareness of abnormal movements (rate only patient's report): No Awareness, Dental Status Current problems with teeth and/or dentures?: No Does patient usually wear dentures?:  (pt reports artificial teeth on the bottom)  CIWA:  CIWA-Ar Total: 0 COWS:  COWS Total Score: 1  Treatment Plan Summary: Daily contact with patient to assess and evaluate symptoms and progress in treatment Medication management  Plan:1. Continue crisis management and stabilization. 2. Medication management to reduce current symptoms to base line and improve the patient's overall level of functioning 3. Treat health problems as indicated. 4. Develop treatment plan to decrease risk of relapse upon discharge and the need for readmission. 5. Psycho-social education regarding relapse prevention and self care. 6. Health care follow up as needed for medical problems. 7. Restart home medications where appropriate. 8. Continue Celexa to 20mg  po daily for depression/PTSD 9. Continue Haldol  5 mg at qhs for psychosis.   Medical Decision Making Problem Points:  Established problem, stable/improving (1), Review of last therapy session (1) and Review of psycho-social stressors (1) Data Points:  Order Aims Assessment (2) Review of medication regiment & side effects (2) Review of new medications or change in dosage (2)  I certify that inpatient services furnished can reasonably be expected to improve the patient's condition.    Thedore Mins, MD 05/23/2013 10:36 AM

## 2013-05-23 NOTE — [facility] Group Notes (Signed)
[facility] LCSW Aftercare Discharge Planning Group Note   05/23/2013 11:23 AM  Participation Quality:  Engaged  Mood/Affect:  Depressed and Flat  Depression Rating:  denies  Anxiety Rating:  denies  Thoughts of Suicide:  No Will you contract for safety?   NA  Current AVH:  No  Plan for Discharge/Comments:  Feels he is ready to go tomorrow.  Plans to stay with friends here until he flies to [place] next Saturday.  Is unsure of who the friends are.  Says others are working on setting that up.    Transportation Means: friends  Supports: friends  [place], [place] B

## 2013-05-23 NOTE — Tx Team (Signed)
  Interdisciplinary Treatment Plan Update   Date Reviewed:  05/23/2013  Time Reviewed:  1:49 PM  Progress in Treatment:   Attending groups: Yes Participating in groups: Yes Taking medication as prescribed: Yes  Tolerating medication: Yes Family/Significant other contact made: Yes  Patient understands diagnosis: Yes  Discussing patient identified problems/goals with staff: Yes Medical problems stabilized or resolved: Yes Denies suicidal/homicidal ideation: Yes Patient has not harmed self or others: Yes  For review of initial/current patient goals, please see plan of care.  Estimated Length of Stay:  Likely d/c tomorrow  Reason for Continuation of Hospitalization:   New Problems/Goals identified:  N/A  Discharge Plan or Barriers:   Stay with friends in [place] until flight to [place] on Dec 13  Additional Comments:  Attendees:  Signature: Thedore Mins, MD 05/23/2013 1:49 PM   Signature: Richelle Ito, LCSW 05/23/2013 1:49 PM  Signature: Fransisca Kaufmann, NP 05/23/2013 1:49 PM  Signature: Joslyn Devon, RN 05/23/2013 1:49 PM  Signature: Liborio Nixon, RN 05/23/2013 1:49 PM  Signature:  05/23/2013 1:49 PM  Signature:   05/23/2013 1:49 PM  Signature:    Signature:    Signature:    Signature:    Signature:    Signature:      Scribe for Treatment Team:   Richelle Ito, LCSW  05/23/2013 1:49 PM

## 2013-05-23 NOTE — Progress Notes (Signed)
Pt observed in his room in bed with covers pulled up to his head.  Pt states he is tired and that his knee is hurting.  Pt had received pain med earlier in the evening.  He says it is getting better, but he is still tired.  Pt is being discharged tomorrow and staying with friends until he flies to [place] to stay with his sponsor.  Pt is cooperative/polite.  Pt denies SI/HI/AV at this time.  Pt was encouraged to make his needs known to staff.  Writer discussed pt's hs meds with him and pt voices understanding.  Pt voices no other needs or concerns at this time.  Support and encouragement offered.  Safety maintained with q15 minute checks.

## 2013-05-23 NOTE — Progress Notes (Signed)
D: Pt presents sad with soft speech. Pt denies SI/HI/AVH. No complaints verbalized by pt at this time. Pt compliant with taking meds and attending groups. Pt continues to ambulate with walker d/t intermittent right knee pain. A: medications administered as ordered per MD. Verbal support given. Pt encouraged to attend groups. 15 minute checks performed for safety. R: Pt safety maintained.

## 2013-05-23 NOTE — Progress Notes (Signed)
Adult Psychoeducational Group Note  Date:  05/23/2013 Time:  1:12 PM  Group Topic/Focus:  Dimensions of Wellness:   The focus of this group is to introduce the topic of wellness and discuss the role each dimension of wellness plays in total health.  Participation Level:  None  Participation Quality:  Attentive  Affect:  Flat  Cognitive:  Appropriate  Insight: None  Engagement in Group:  None  Modes of Intervention:  Education  Additional Comments:  Patient attended group but did not participate.  Merleen Milliner 05/23/2013, 1:12 PM

## 2013-05-23 NOTE — BHH Group Notes (Signed)
[facility] [employer] Group Therapy  05/23/2013  2:03 PM  Type of Therapy:  [employer] Presentation   Participation Level:  Minimal  Participation Quality:  Attentive  Affect:  Appropriate  Cognitive:  Appropriate  Insight:  Engaged  Engagement in Therapy:  Poor  Modes of Intervention:  Discussion, Education and Socialization   Summary of Progress/Problems:  Onalee Hua from [employer] came to present his recovery story and play the guitar.  Trevor Blackwell sat quietly as the speaker told his story.  He did not engage with the speaker.  He applauded after each song.    Trevor Blackwell   05/23/2013  2:03 PM

## 2013-05-24 MED ORDER — TRAZODONE HCL 50 MG PO TABS: 50.0000 mg | ORAL_TABLET | Freq: Every evening | ORAL | Status: AC | PRN

## 2013-05-24 MED ORDER — LISINOPRIL 10 MG PO TABS
10.0000 mg | ORAL_TABLET | Freq: Every day | ORAL | Status: DC
Start: 2013-05-24 — End: 2013-05-24

## 2013-05-24 MED ORDER — CITALOPRAM HYDROBROMIDE 20 MG PO TABS: 20.0000 mg | ORAL_TABLET | Freq: Every day | ORAL | Status: DC

## 2013-05-24 MED ORDER — HALOPERIDOL 5 MG PO TABS: 5.0000 mg | ORAL_TABLET | Freq: Every day | ORAL | Status: DC

## 2013-05-24 MED ORDER — LISINOPRIL 10 MG PO TABS: 10.0000 mg | ORAL_TABLET | Freq: Every day | ORAL | Status: AC

## 2013-05-24 MED ORDER — TRAZODONE HCL 50 MG PO TABS: 50.0000 mg | ORAL_TABLET | Freq: Every evening | ORAL | Status: DC | PRN

## 2013-05-24 MED ORDER — HALOPERIDOL 5 MG PO TABS: 5.0000 mg | ORAL_TABLET | Freq: Every day | ORAL | Status: AC

## 2013-05-24 MED ORDER — CITALOPRAM HYDROBROMIDE 20 MG PO TABS: 20.0000 mg | ORAL_TABLET | Freq: Every day | ORAL | Status: AC

## 2013-05-24 NOTE — Progress Notes (Signed)
[facility] Adult Case Management Discharge Plan :  Will you be returning to the same living situation after discharge: No. At discharge, do you have transportation home?:Yes,  friend Dorann Lodge Do you have the ability to pay for your medications:Yes,  mental health  Release of information consent forms completed and in the chart;  Patient's signature needed at discharge.  Patient to Follow up at: Follow-up Information   Follow up with [facility]. (Go to the walk-in clinic M-F between 8 and 9AM for your hospital follow-up appointment if you are having more symptoms or feeling like your meds need to be adjusted)    Contact information:   [address]  [place]  [336] [phone]      Follow up with [facility] On 06/19/2013. (10:45 with Otila Back   Before this appointment, you must apply for Medical Assistance through [facility] DSS at [phone])    Contact information:   [address] [place], [place]  [phone]      Follow up with [facility] On 06/25/2013. (10:00 with Otila Back)       Follow up with [facility] On 07/26/2013. (10:00 with Dr Mammie Russian)       Patient denies SI/HI:   Yes,  yes    Safety Planning and Suicide Prevention discussed:  Yes,  yes  Ida Rogue 05/24/2013, 10:17 AM
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
## 2013-05-24 NOTE — BHH Group Notes (Signed)
BHH Group Notes:  (Counselor/Nursing/MHT/Case Management/Adjunct)  05/24/2013 1:15PM  Type of Therapy:  Group Therapy  Participation Level:  Active  Participation Quality:  Appropriate  Affect:  Flat  Cognitive:  Oriented  Insight:  Improving  Engagement in Group:  Limited  Engagement in Therapy:  Limited  Modes of Intervention:  Discussion, Exploration and Socialization  Summary of Progress/Problems: The topic for group was balance in life.  Pt participated in the discussion about when their life was in balance and out of balance and how this feels.  Pt discussed ways to get back in balance and short term goals they can work on to get where they want to be. Sat quietly throughout.  Offered nothing.   Trevor Blackwell 05/24/2013 1:53 PM

## 2013-05-24 NOTE — Progress Notes (Signed)
Discharge Note: Discharge instructions/prescriptions/medication samples given to patient. Patient verbalized understanding of discharge instructions and prescriptions. Returned belongings to patient. Denies SI/HI/AVH. Patient d/c without incident to the lobby and transported by a sponsor.
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
## 2013-05-25 NOTE — Discharge Summary (Signed)
Seen and agreed. Burl Tauzin, MD 

## 2013-05-29 NOTE — Progress Notes (Signed)
Patient Discharge Instructions:  After Visit Summary (AVS):   Faxed to:  05/29/13 Discharge Summary Note:   Faxed to:  05/29/13 Psychiatric Admission Assessment Note:   Faxed to:  05/29/13 Suicide Risk Assessment - Discharge Assessment:   Faxed to:  05/29/13 Faxed/Sent to the Next Level Care provider:  05/29/13 Faxed to [facility] @ [phone] Faxed to [facility] @ [phone]  Jerelene Redden, 05/29/2013, 3:56 PM

## 2015-10-28 IMAGING — CR DG KNEE COMPLETE 4+V*R*
4 series · 4 of 4 positions shown · non-contrast
Comparison: 09/02/2012

CLINICAL DATA: Fall, right knee pain

EXAM:
RIGHT KNEE - COMPLETE 4+ VIEW

[t knee ap right]
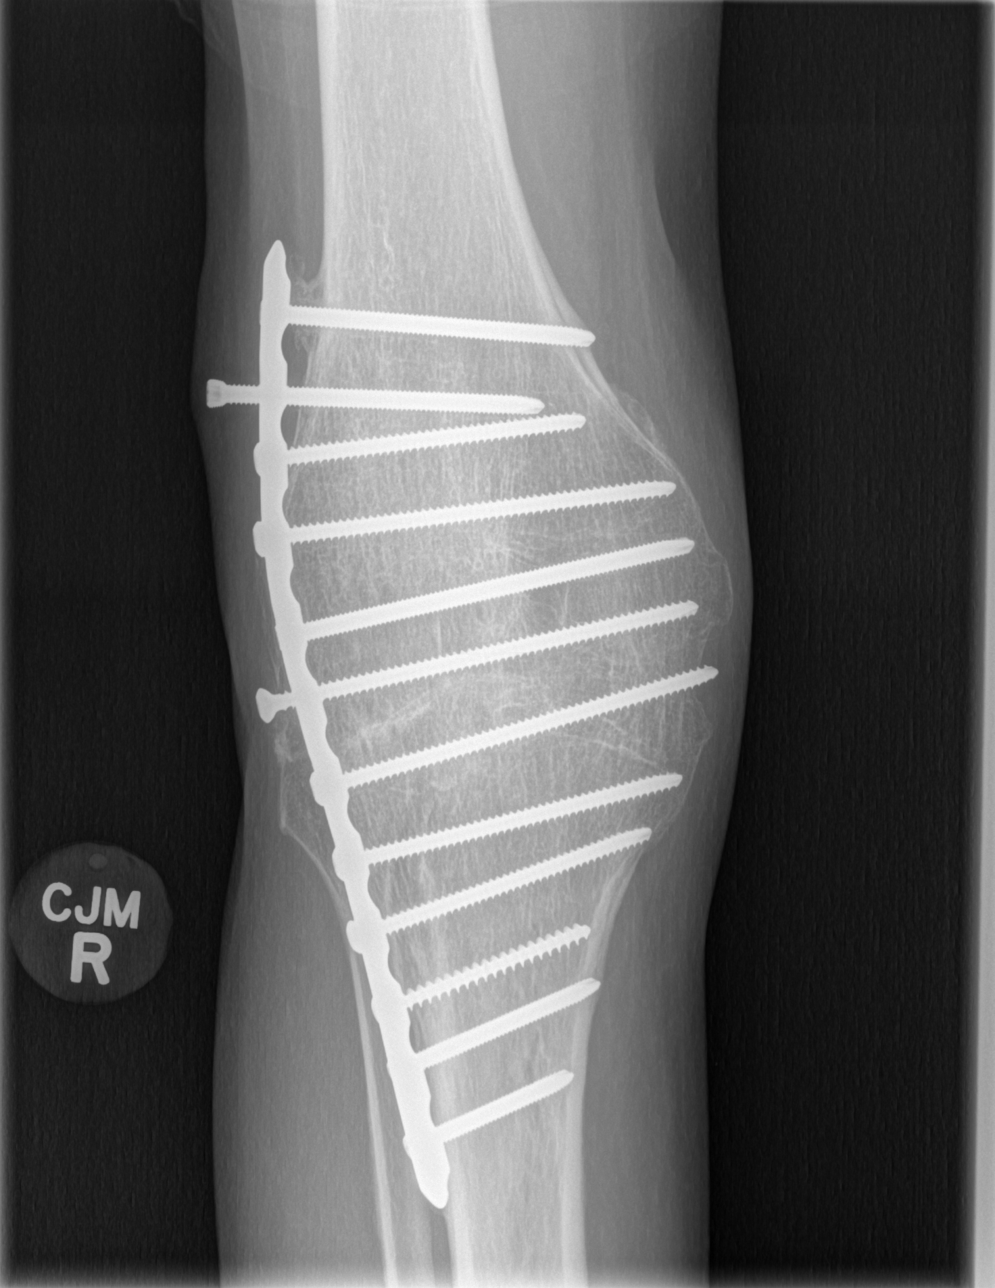

[t knee oblique right (1 of 2)]
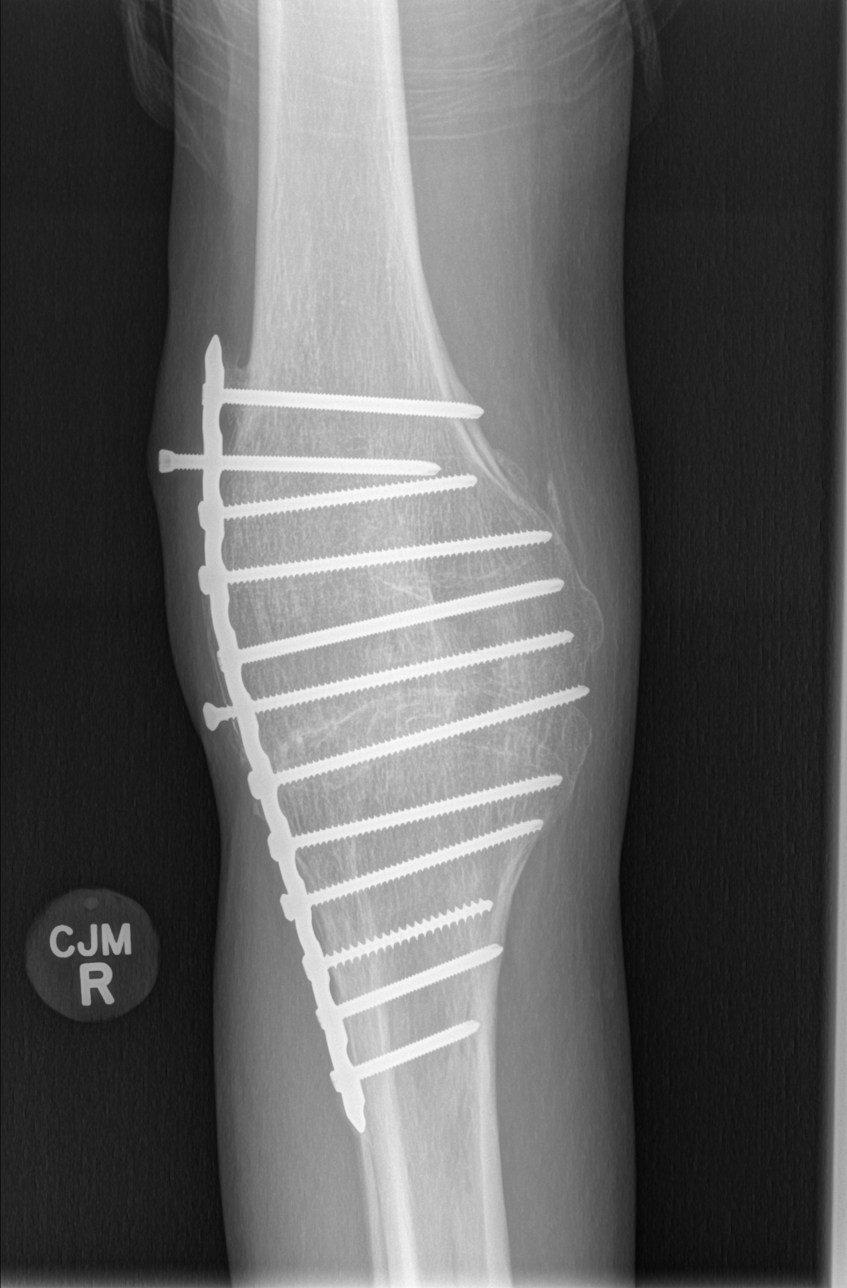

[t knee oblique right (2 of 2)]
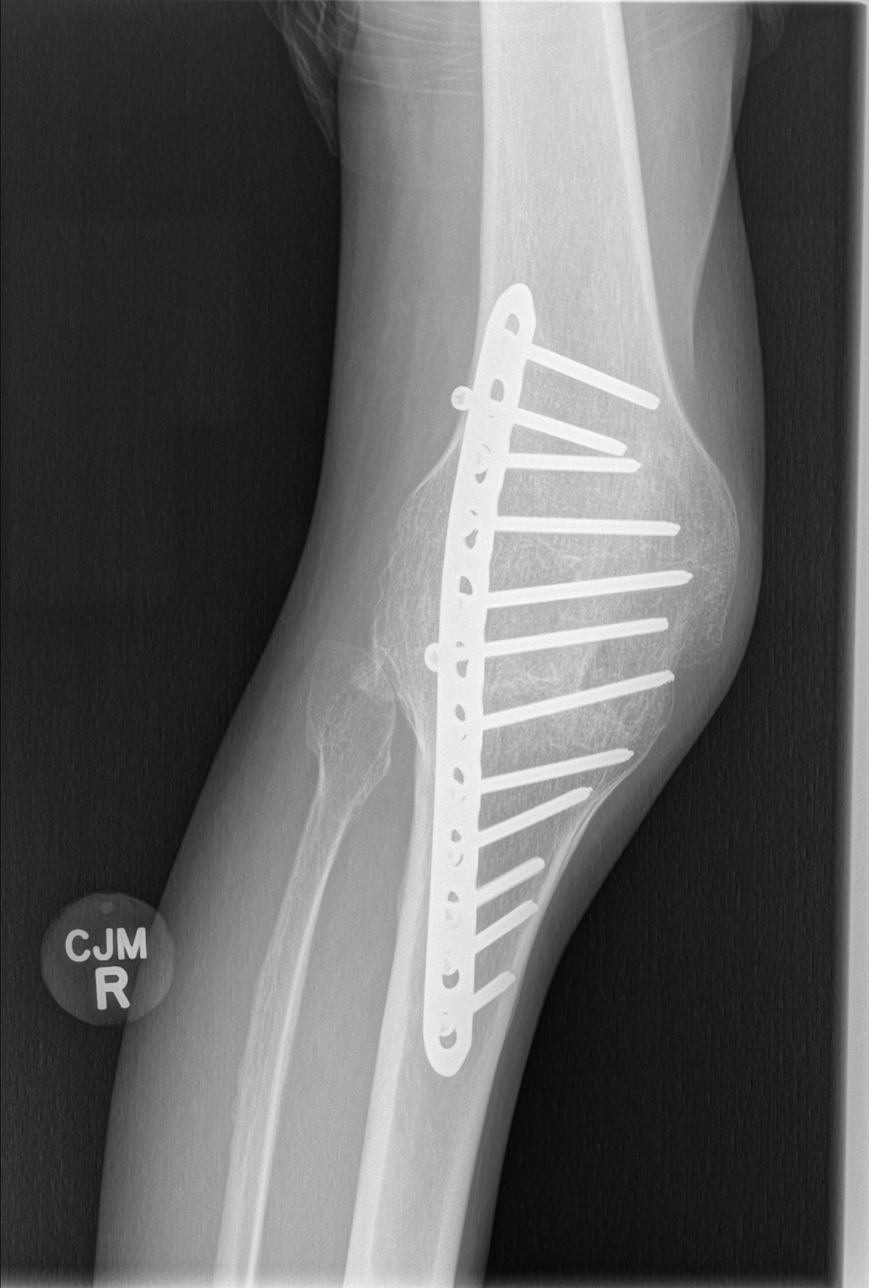

[t knee lat right]
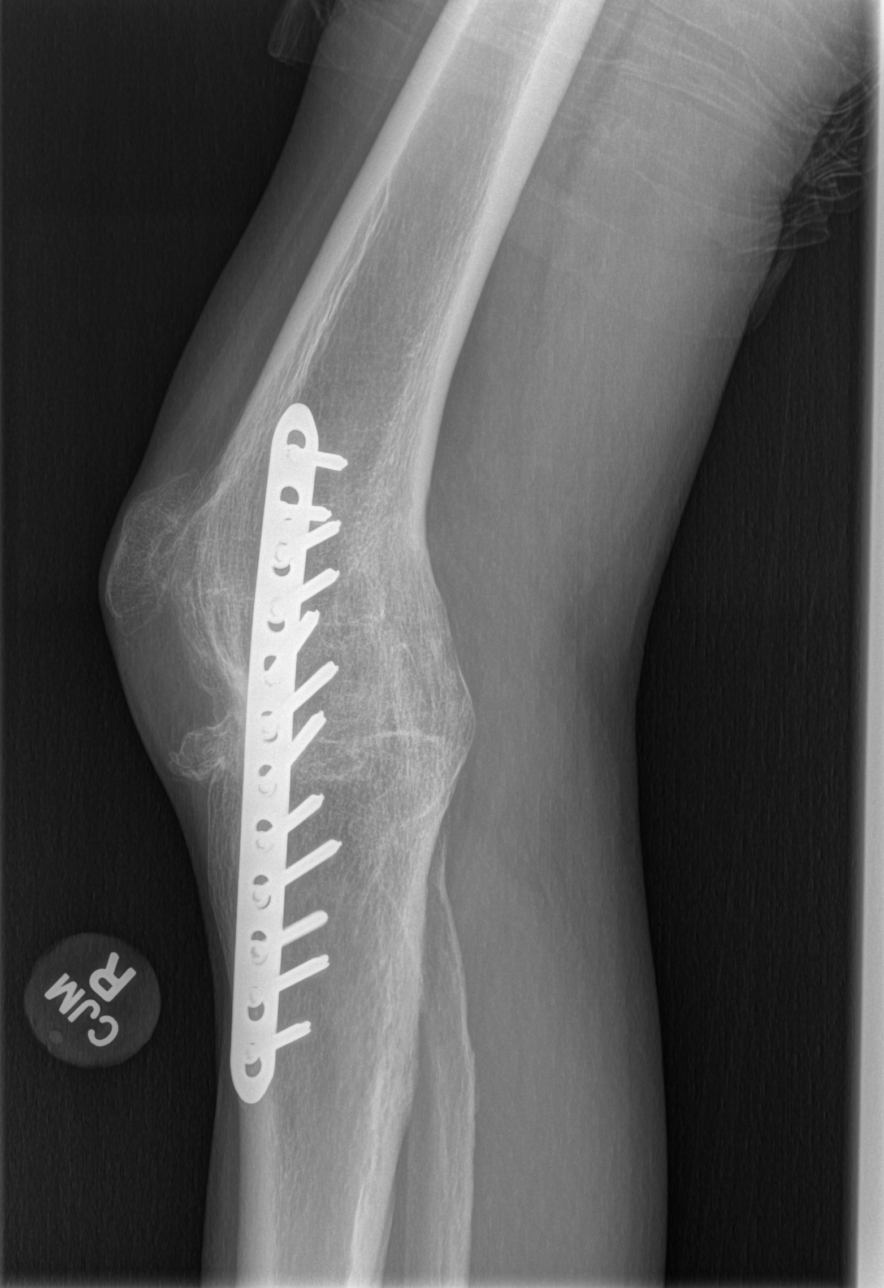

[4 of 4 positions shown; findings below may reference images not displayed]

FINDINGS: Four views of right knee submitted. Again noted lateral fixation
plate and fixation screws with fusion of the knee joint. No evidence
of loosening of prosthetic material. Diffuse osteopenia. No joint
effusion.
IMPRESSION: No acute fracture or subluxation. Stable postsurgical changes with
metallic fusion material. Diffuse osteopenia.
